# Patient Record
Sex: Female | Born: 1955 | Race: White | Hispanic: No | Marital: Married | Smoking: Never smoker
Health system: Southern US, Academic
[De-identification: ages and names within clinical notes are randomized; demographics above are authoritative.]

## PROBLEM LIST (undated history)

## (undated) DIAGNOSIS — F419 Anxiety disorder, unspecified: Secondary | ICD-10-CM

## (undated) DIAGNOSIS — K760 Fatty (change of) liver, not elsewhere classified: Secondary | ICD-10-CM

## (undated) DIAGNOSIS — N816 Rectocele: Secondary | ICD-10-CM

## (undated) DIAGNOSIS — M549 Dorsalgia, unspecified: Secondary | ICD-10-CM

## (undated) DIAGNOSIS — R109 Unspecified abdominal pain: Secondary | ICD-10-CM

## (undated) DIAGNOSIS — M25552 Pain in left hip: Secondary | ICD-10-CM

## (undated) DIAGNOSIS — Z6841 Body Mass Index (BMI) 40.0 and over, adult: Secondary | ICD-10-CM

## (undated) DIAGNOSIS — Z1239 Encounter for other screening for malignant neoplasm of breast: Secondary | ICD-10-CM

## (undated) DIAGNOSIS — E1140 Type 2 diabetes mellitus with diabetic neuropathy, unspecified: Secondary | ICD-10-CM

## (undated) DIAGNOSIS — N3281 Overactive bladder: Secondary | ICD-10-CM

## (undated) DIAGNOSIS — K5909 Other constipation: Secondary | ICD-10-CM

## (undated) DIAGNOSIS — N8110 Cystocele, unspecified: Secondary | ICD-10-CM

## (undated) DIAGNOSIS — E538 Deficiency of other specified B group vitamins: Secondary | ICD-10-CM

## (undated) DIAGNOSIS — K219 Gastro-esophageal reflux disease without esophagitis: Secondary | ICD-10-CM

## (undated) DIAGNOSIS — E559 Vitamin D deficiency, unspecified: Secondary | ICD-10-CM

## (undated) DIAGNOSIS — M47819 Spondylosis without myelopathy or radiculopathy, site unspecified: Secondary | ICD-10-CM

## (undated) DIAGNOSIS — E119 Type 2 diabetes mellitus without complications: Secondary | ICD-10-CM

## (undated) DIAGNOSIS — I10 Essential (primary) hypertension: Secondary | ICD-10-CM

## (undated) DIAGNOSIS — U071 COVID-19: Secondary | ICD-10-CM

## (undated) DIAGNOSIS — G8929 Other chronic pain: Secondary | ICD-10-CM

## (undated) DIAGNOSIS — I739 Peripheral vascular disease, unspecified: Secondary | ICD-10-CM

## (undated) DIAGNOSIS — N819 Female genital prolapse, unspecified: Secondary | ICD-10-CM

## (undated) DIAGNOSIS — E7800 Pure hypercholesterolemia, unspecified: Secondary | ICD-10-CM

## (undated) HISTORY — PX: HX APPENDECTOMY: SHX54

## (undated) HISTORY — DX: Other chronic pain: G89.29

## (undated) HISTORY — DX: Other constipation: K59.09

## (undated) HISTORY — PX: HX TUBAL LIGATION: SHX77

## (undated) HISTORY — DX: Fatty (change of) liver, not elsewhere classified: K76.0

## (undated) HISTORY — DX: Overactive bladder: N32.81

## (undated) HISTORY — DX: Body Mass Index (BMI) 40.0 and over, adult: Z684

## (undated) HISTORY — PX: COLONOSCOPY: WVUENDOPRO10

## (undated) HISTORY — DX: Gastro-esophageal reflux disease without esophagitis: K21.9

## (undated) HISTORY — DX: Deficiency of other specified B group vitamins: E53.8

## (undated) HISTORY — DX: Pain in left hip: M25.552

## (undated) HISTORY — DX: Peripheral vascular disease, unspecified: I73.9

## (undated) HISTORY — PX: REPAIR RECTOCELE: SUR1206

## (undated) HISTORY — DX: Rectocele: N81.6

## (undated) HISTORY — DX: Type 2 diabetes mellitus without complications: E11.9

## (undated) HISTORY — DX: Dorsalgia, unspecified: M54.9

## (undated) HISTORY — DX: Essential (primary) hypertension: I10

## (undated) HISTORY — DX: Pure hypercholesterolemia, unspecified: E78.00

## (undated) HISTORY — DX: Spondylosis without myelopathy or radiculopathy, site unspecified: M47.819

## (undated) HISTORY — DX: COVID-19: U07.1

## (undated) HISTORY — DX: Cystocele, unspecified: N81.10

## (undated) HISTORY — DX: Unspecified abdominal pain: R10.9

## (undated) HISTORY — DX: Anxiety disorder, unspecified: F41.9

## (undated) HISTORY — PX: BLADDER SURGERY: SHX569

## (undated) HISTORY — DX: Vitamin D deficiency, unspecified: E55.9

## (undated) HISTORY — DX: Female genital prolapse, unspecified: N81.9

## (undated) HISTORY — DX: Morbid (severe) obesity due to excess calories: E66.01

## (undated) HISTORY — DX: Type 2 diabetes mellitus with diabetic neuropathy, unspecified: E11.40

## (undated) HISTORY — DX: Encounter for other screening for malignant neoplasm of breast: Z12.39

## (undated) HISTORY — PX: HX HYSTERECTOMY: SHX81

---

## 1991-05-02 HISTORY — PX: HX TUBAL LIGATION: SHX77

## 1993-05-01 HISTORY — PX: HX HYSTERECTOMY: SHX81

## 1993-06-16 ENCOUNTER — Other Ambulatory Visit (HOSPITAL_COMMUNITY): Payer: Self-pay | Admitting: OBSTETRICS/GYNECOLOGY

## 2018-06-27 DIAGNOSIS — K5909 Other constipation: Secondary | ICD-10-CM

## 2018-06-27 DIAGNOSIS — N3281 Overactive bladder: Secondary | ICD-10-CM | POA: Insufficient documentation

## 2018-06-27 DIAGNOSIS — E1142 Type 2 diabetes mellitus with diabetic polyneuropathy: Secondary | ICD-10-CM | POA: Insufficient documentation

## 2018-06-27 DIAGNOSIS — E119 Type 2 diabetes mellitus without complications: Secondary | ICD-10-CM | POA: Insufficient documentation

## 2018-06-27 DIAGNOSIS — E669 Obesity, unspecified: Secondary | ICD-10-CM | POA: Insufficient documentation

## 2018-06-27 DIAGNOSIS — N819 Female genital prolapse, unspecified: Secondary | ICD-10-CM

## 2018-06-27 HISTORY — DX: Female genital prolapse, unspecified: N81.9

## 2018-06-27 HISTORY — DX: Overactive bladder: N32.81

## 2018-06-27 HISTORY — DX: Other constipation: K59.09

## 2019-11-30 HISTORY — PX: BLADDER SURGERY: SHX569

## 2020-11-02 IMAGING — MR MRI HIP RT W/O CONTRAST
5 of 6 series · 27 of 40 positions shown · IV contrast (gadolinium)
Comparison: None available.

﻿EXAM:  [id number]   MRI HIP RT W/O CONTRAST
INDICATION: Right hip pain for several months.
TECHNIQUE: Multiplanar multisequential MRI of the pelvis and right hip joint was performed without gadolinium contrast.

[Series 13: T1 · axial · right · 6.0mm · 0.68mm/px · z∈[-9,+180]mm · 8 of 30 slices shown (1 of 3)]
[im 1/30]
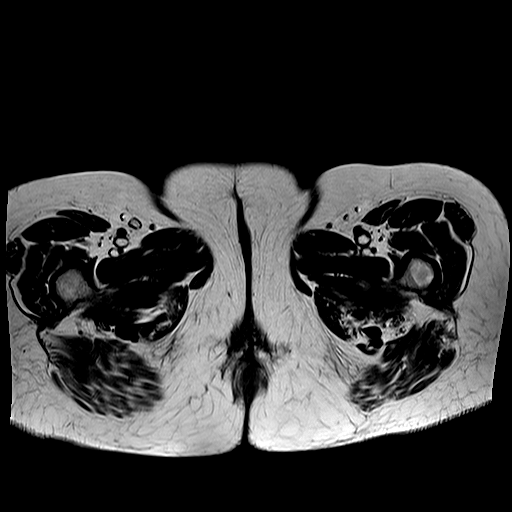
[im 5/30]
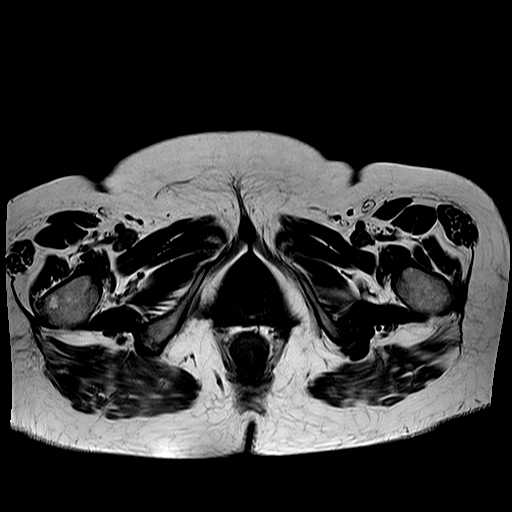
[im 9/30]
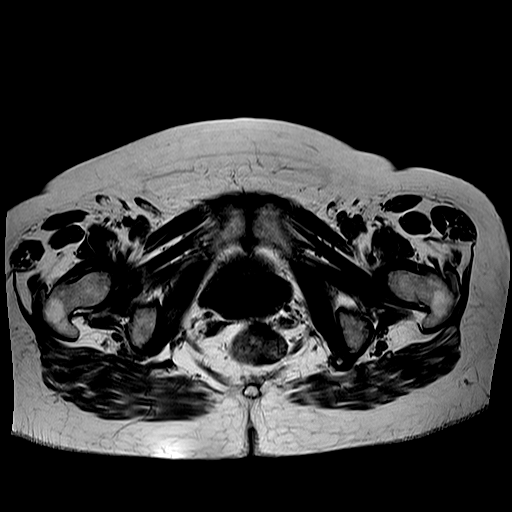
[im 13/30]
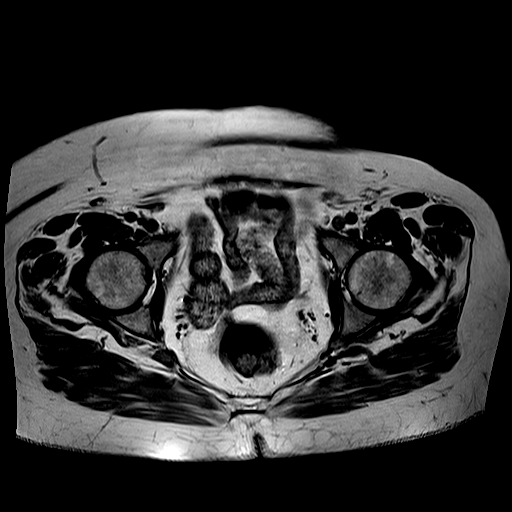
[im 17/30]
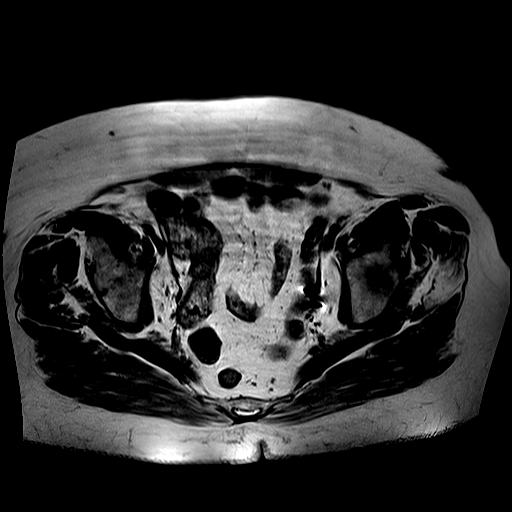
[im 21/30]
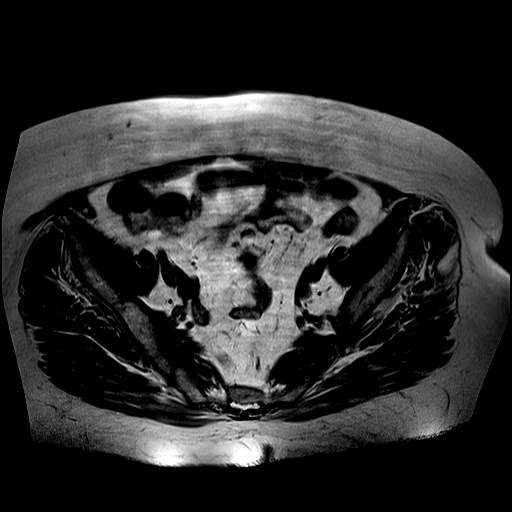
[im 25/30]
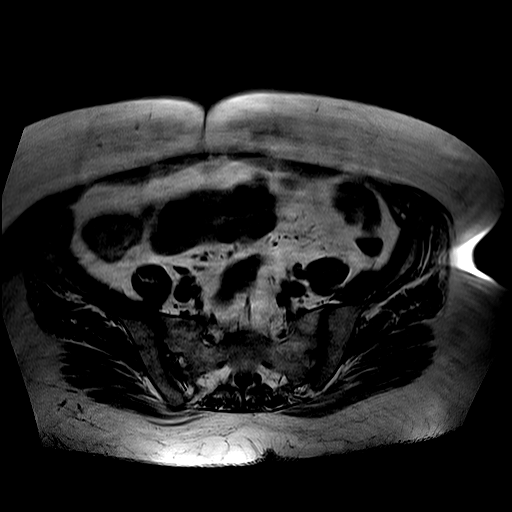
[im 30/30]
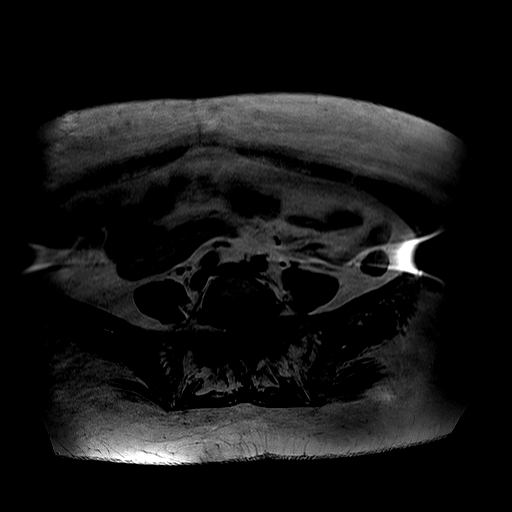

[Series 15: T1 · sagittal · right · 6.0mm · 0.62mm/px · 6 of 20 slices shown (2 of 3)]
[im 1/20]
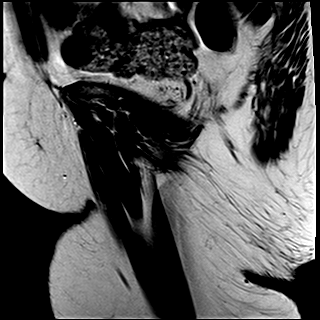
[im 4/20]
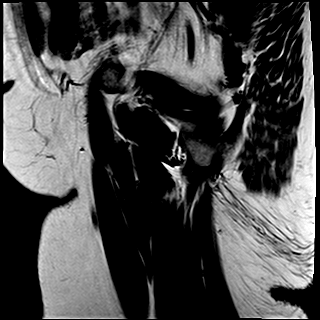
[im 8/20]
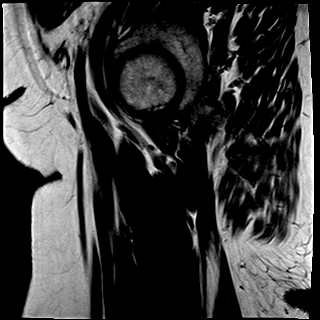
[im 12/20]
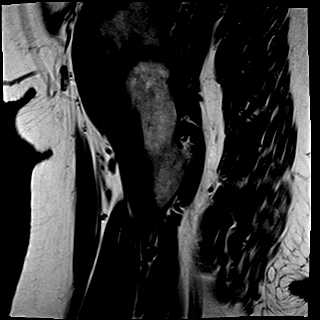
[im 16/20]
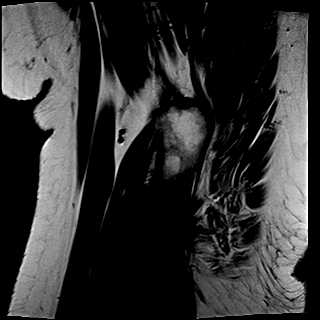
[im 20/20]
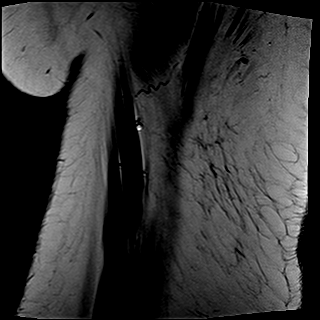

[Series 16: T2 fat-sat · sagittal · right · 6.0mm · 0.78mm/px · 6 of 20 slices shown (1 of 2)]
[im 1/20]
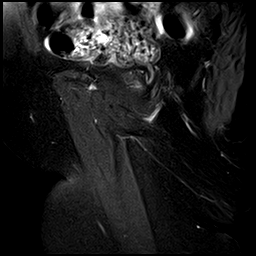
[im 4/20]
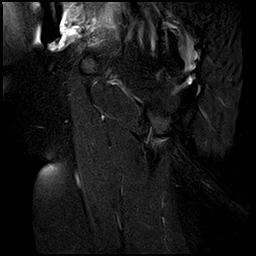
[im 8/20]
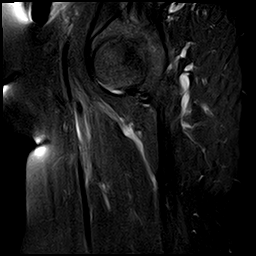
[im 12/20]
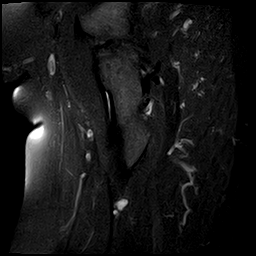
[im 16/20]
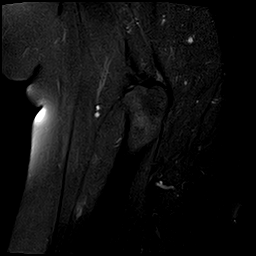
[im 20/20]
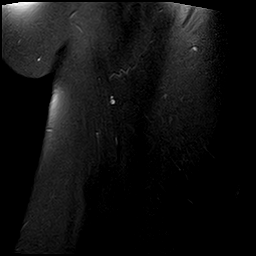

[Series 17: T1 · coronal · right · 7.0mm · 0.70mm/px · 1 of 20 slices shown (3 of 3)]
[im 1/20]
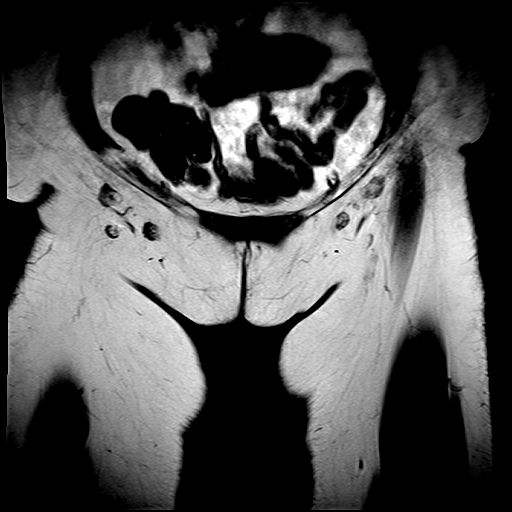

[Series 18: T2 fat-sat · coronal · right · 7.0mm · 0.70mm/px · 6 of 20 slices shown (2 of 2)]
[im 1/20]
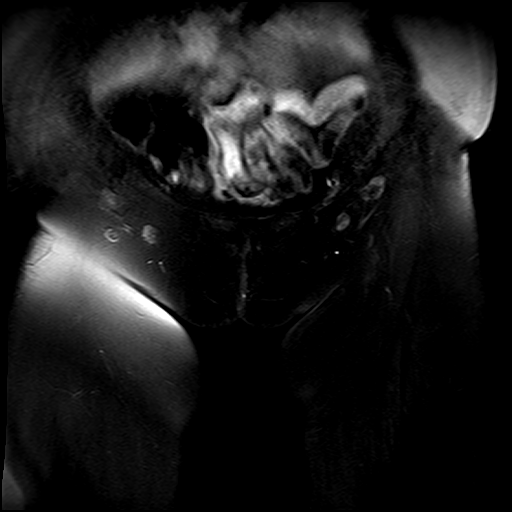
[im 4/20]
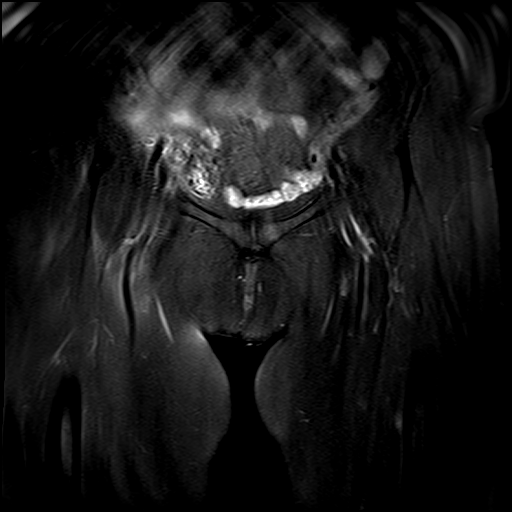
[im 8/20]
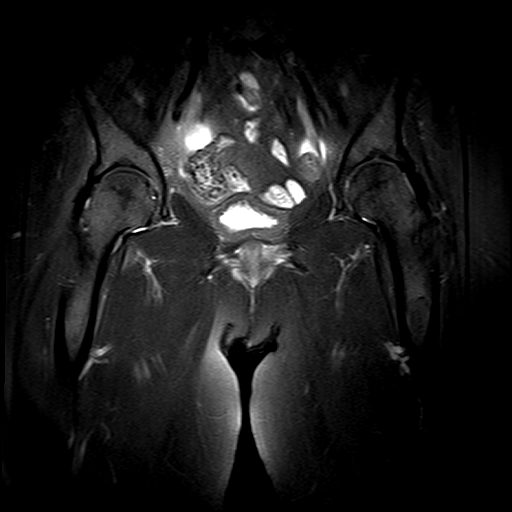
[im 12/20]
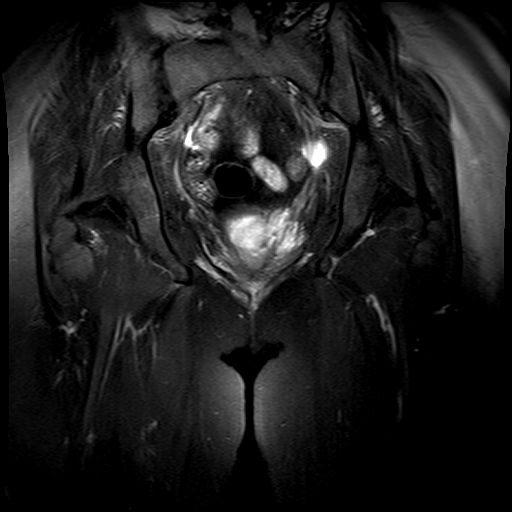
[im 16/20]
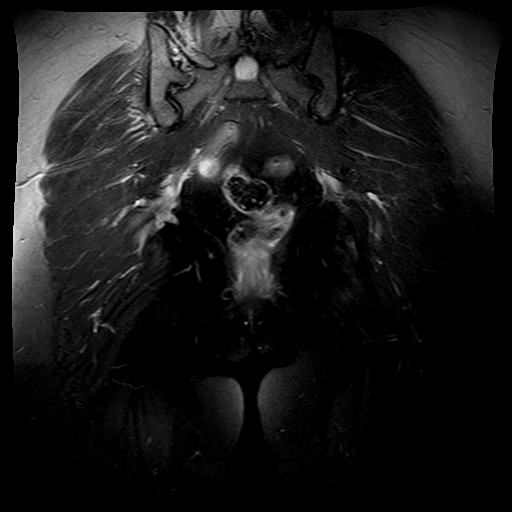
[im 20/20]
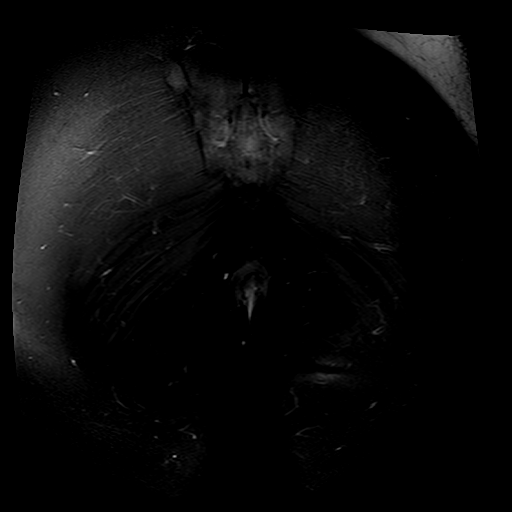

[27 of 40 positions shown; findings below may reference images not displayed]

FINDINGS: Bone marrow signal intensity is normal. There is no acute fracture or subluxation. Hip joints are well maintained. There is no significant hip effusion on either side. There is moderate right and mild left hip proximal hamstring tendinopathy. There is no evidence of iliopsoas or greater trochanteric bursitis. Muscles and soft tissues about the pelvic girdle appear unremarkable. Evaluation of pelvic parenchymal structures is limited without definite abnormality.
IMPRESSION: Moderate right and mild left proximal hamstring tendinopathy.

## 2021-05-01 HISTORY — PX: REPAIR RECTOCELE: SUR1206

## 2021-07-09 ENCOUNTER — Other Ambulatory Visit (RURAL_HEALTH_CENTER): Payer: Self-pay | Admitting: Family

## 2021-08-01 ENCOUNTER — Encounter (RURAL_HEALTH_CENTER): Payer: Self-pay | Admitting: Family

## 2021-08-01 ENCOUNTER — Ambulatory Visit (RURAL_HEALTH_CENTER): Payer: Medicare Other | Attending: Family | Admitting: Family

## 2021-08-01 ENCOUNTER — Other Ambulatory Visit: Payer: Self-pay

## 2021-08-01 VITALS — BP 128/84 | HR 94 | Temp 98.9°F | Resp 19 | Ht 66.0 in | Wt 231.4 lb

## 2021-08-01 DIAGNOSIS — K219 Gastro-esophageal reflux disease without esophagitis: Secondary | ICD-10-CM | POA: Insufficient documentation

## 2021-08-01 DIAGNOSIS — Z7182 Exercise counseling: Secondary | ICD-10-CM | POA: Insufficient documentation

## 2021-08-01 DIAGNOSIS — E1169 Type 2 diabetes mellitus with other specified complication: Secondary | ICD-10-CM | POA: Insufficient documentation

## 2021-08-01 DIAGNOSIS — Z7985 Long-term (current) use of injectable non-insulin antidiabetic drugs: Secondary | ICD-10-CM | POA: Insufficient documentation

## 2021-08-01 DIAGNOSIS — Z7984 Long term (current) use of oral hypoglycemic drugs: Secondary | ICD-10-CM | POA: Insufficient documentation

## 2021-08-01 DIAGNOSIS — F419 Anxiety disorder, unspecified: Secondary | ICD-10-CM

## 2021-08-01 DIAGNOSIS — I129 Hypertensive chronic kidney disease with stage 1 through stage 4 chronic kidney disease, or unspecified chronic kidney disease: Secondary | ICD-10-CM | POA: Insufficient documentation

## 2021-08-01 DIAGNOSIS — J3089 Other allergic rhinitis: Secondary | ICD-10-CM | POA: Insufficient documentation

## 2021-08-01 DIAGNOSIS — Z713 Dietary counseling and surveillance: Secondary | ICD-10-CM | POA: Insufficient documentation

## 2021-08-01 DIAGNOSIS — Z1239 Encounter for other screening for malignant neoplasm of breast: Secondary | ICD-10-CM | POA: Insufficient documentation

## 2021-08-01 DIAGNOSIS — E1122 Type 2 diabetes mellitus with diabetic chronic kidney disease: Secondary | ICD-10-CM | POA: Insufficient documentation

## 2021-08-01 DIAGNOSIS — I10 Essential (primary) hypertension: Secondary | ICD-10-CM | POA: Insufficient documentation

## 2021-08-01 DIAGNOSIS — E1142 Type 2 diabetes mellitus with diabetic polyneuropathy: Secondary | ICD-10-CM | POA: Insufficient documentation

## 2021-08-01 DIAGNOSIS — Z1211 Encounter for screening for malignant neoplasm of colon: Secondary | ICD-10-CM | POA: Insufficient documentation

## 2021-08-01 DIAGNOSIS — E782 Mixed hyperlipidemia: Secondary | ICD-10-CM | POA: Insufficient documentation

## 2021-08-01 DIAGNOSIS — E559 Vitamin D deficiency, unspecified: Secondary | ICD-10-CM | POA: Insufficient documentation

## 2021-08-01 DIAGNOSIS — Z6841 Body Mass Index (BMI) 40.0 and over, adult: Secondary | ICD-10-CM | POA: Insufficient documentation

## 2021-08-01 HISTORY — DX: Anxiety disorder, unspecified: F41.9

## 2021-08-01 MED ORDER — METFORMIN 500 MG TABLET
1000.0000 mg | ORAL_TABLET | Freq: Two times a day (BID) | ORAL | 1 refills | Status: DC
Start: 2021-08-01 — End: 2022-03-13

## 2021-08-01 MED ORDER — ROSUVASTATIN 20 MG TABLET
20.0000 mg | ORAL_TABLET | Freq: Every day | ORAL | 1 refills | Status: DC
Start: 2021-08-01 — End: 2022-03-09

## 2021-08-01 MED ORDER — CYANOCOBALAMIN (VIT B-12) 1,000 MCG TABLET
1000.0000 ug | ORAL_TABLET | Freq: Every day | ORAL | 1 refills | Status: DC
Start: 2021-08-01 — End: 2022-06-19

## 2021-08-01 MED ORDER — OMEGA-3 ACID ETHYL ESTERS 1 GRAM CAPSULE
2.0000 g | ORAL_CAPSULE | Freq: Two times a day (BID) | ORAL | 1 refills | Status: DC
Start: 2021-08-01 — End: 2022-01-31

## 2021-08-01 NOTE — Progress Notes (Signed)
FAMILY MEDICINE, [facility] FAMILY MEDICINE [facility]  [address]  [place] [address]  Operated by [facility]     Name: Amber Owens MRN:  [id number]   Date of Birth: 1956-03-19 Age: 66 y.o.   Date: 08/01/2021  Time: 15:36     Provider: Stormy Fabian, NP-C    Reason for visit: Follow Up 6 Months      History of Present Illness:  Amber Owens is a 66 y.o. female presenting with chronic disease management.     Patient Active Problem List    Diagnosis Date Noted   . 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  Last eye exam 12/18/20, Dr. Micheline Rough.      . Essential hypertension 08/01/2021   . Mixed hyperlipidemia 08/01/2021   . Gastroesophageal reflux disease without esophagitis 08/01/2021   . Anxiety 08/01/2021   . Morbid obesity with BMI of 40.0-44.9, adult (CMS HCC) 08/01/2021   . Environmental and seasonal allergies 08/01/2021       Historical Data    Past Medical History:  No past medical history on file.  Past Surgical History:  No past surgical history on file.  Allergies:  No Known Allergies  Medications:  Current Outpatient Medications   Medication Sig   . aspirin (ECOTRIN) 81 mg Oral Tablet, Delayed Release (E.C.) Take 1 Tablet (81 mg total) by mouth Once a day   . cholecalciferol, vitamin D3, 25 mcg (1,000 unit) Oral Tablet Take 1 Tablet (1,000 Units total) by mouth Once a day   . collagen/biotin/ascorbic acid (COLLAGEN 1500 PLUS C ORAL) Take 1,000 mg by mouth   . cyanocobalamin (VITAMIN B 12) 1,000 mcg Oral Tablet Take 1 Tablet (1,000 mcg total) by mouth Once a day   . cyclobenzaprine (FLEXERIL) 10 mg Oral Tablet Take 1 Tablet (10 mg total) by mouth   . liraglutide (VICTOZA 2-PAK) 0.6 mg/0.1 mL (18 mg/3 mL) Subcutaneous Pen Injector Inject 0.6 mg under the skin Once a day (Patient not taking: Reported on 08/01/2021)   . losartan (COZAAR) 25 mg Oral Tablet TAKE 1 TABLET BY MOUTH EVERY DAY FOR HYPERTENSION   . magnesium chloride (SLOW-MAG) 64  mg Oral Tablet, Delayed Release (E.C.) Take 1 Tablet (64 mg total) by mouth   . metFORMIN (GLUCOPHAGE) 500 mg Oral Tablet Take 2 Tablets (1,000 mg total) by mouth Twice daily   . multivit-min-mfolate-K-herb289 (ALIVE WOMEN'S 50 PLUS ULTRA) 800 mcg DFE- 150 mcg Oral Tablet Take 1 Tablet by mouth Once a day   . omega-3 fatty acid (LOVAZA) 1 gram Oral Capsule Take 2 Capsules (2 g total) by mouth Twice daily   . omeprazole (PRILOSEC) 20 mg Oral Capsule, Delayed Release(E.C.) Take 1 Capsule (20 mg total) by mouth Once a day   . potassium gluconate 600 mg (99 mg) Oral Tablet Take 1 Tablet by mouth   . rosuvastatin (CRESTOR) 20 mg Oral Tablet Take 1 Tablet (20 mg total) by mouth Once a day     Family History:  Family Medical History:    None         Social History:  Social History     Socioeconomic History   . Marital status: Married           Review of Systems:  Any pertinent Review of Systems as addressed in the HPI above.    Physical Exam:  Vital Signs:  Vitals:    08/01/21 1455  BP: 128/84   Pulse: 94   Resp: 19   Temp: 37.2 C (98.9 F)   SpO2: 95%   Weight: 105 kg (231 lb 6 oz)   Height: 1.676 m (5\' 6" )   BMI: 37.42     Physical Exam  Constitutional:       Appearance: Normal appearance.   HENT:      Head: Normocephalic.      Right Ear: Tympanic membrane normal.      Left Ear: Tympanic membrane normal.      Nose: Rhinorrhea present.      Right Turbinates: Swollen.      Left Turbinates: Enlarged and swollen.      Mouth/Throat:      Mouth: Mucous membranes are dry.      Pharynx: Oropharynx is clear.   Eyes:      Extraocular Movements: Extraocular movements intact.      Conjunctiva/sclera: Conjunctivae normal.      Pupils: Pupils are equal, round, and reactive to light.   Cardiovascular:      Rate and Rhythm: Normal rate and regular rhythm.      Pulses: Normal pulses.      Heart sounds: Normal heart sounds.   Pulmonary:      Effort: Pulmonary effort is normal.      Breath sounds: Normal breath sounds.   Abdominal:       General: Bowel sounds are normal.      Palpations: Abdomen is soft.   Musculoskeletal:         General: Normal range of motion.      Cervical back: Normal range of motion and neck supple.   Skin:     General: Skin is warm and dry.   Neurological:      General: No focal deficit present.      Mental Status: She is alert and oriented to person, place, and time.   Psychiatric:         Mood and Affect: Mood normal.         Behavior: Behavior normal.         Thought Content: Thought content normal.         Judgment: Judgment normal.          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)  (primary encounter diagnosis)  Plan: CBC, COMPREHENSIVE METABOLIC PNL, FASTING,         HGA1C (HEMOGLOBIN A1C WITH EST AVG GLUCOSE),         LIPID PANEL, MICROALBUMIN URINE, RANDOM,         VITAMIN B12, VITAMIN D 25 TOTAL, CANCELED: CBC,        CANCELED: COMPREHENSIVE METABOLIC PNL, FASTING,        CANCELED: HGA1C (HEMOGLOBIN A1C WITH EST AVG         GLUCOSE), CANCELED: LIPID PANEL, CANCELED:         VITAMIN B12, CANCELED: VITAMIN D 25 TOTAL,         CANCELED: MICROALBUMIN URINE, RANDOM    (I10) Essential hypertension  Plan: CBC, COMPREHENSIVE METABOLIC PNL, FASTING,         HGA1C (HEMOGLOBIN A1C WITH EST AVG GLUCOSE),         LIPID PANEL, MICROALBUMIN URINE, RANDOM,         VITAMIN B12, VITAMIN D 25 TOTAL, CANCELED: CBC,        CANCELED: COMPREHENSIVE METABOLIC PNL, FASTING,        CANCELED: HGA1C (HEMOGLOBIN A1C  WITH EST AVG         GLUCOSE), CANCELED: LIPID PANEL, CANCELED:         VITAMIN B12, CANCELED: VITAMIN D 25 TOTAL,         CANCELED: MICROALBUMIN URINE, RANDOM    (E78.2) Mixed hyperlipidemia  Plan: CBC, COMPREHENSIVE METABOLIC PNL, FASTING,         HGA1C (HEMOGLOBIN A1C WITH EST AVG GLUCOSE),         LIPID PANEL, MICROALBUMIN URINE, RANDOM,         VITAMIN B12, VITAMIN D 25 TOTAL, CANCELED: CBC,        CANCELED: COMPREHENSIVE METABOLIC PNL, FASTING,        CANCELED: HGA1C (HEMOGLOBIN A1C  WITH EST AVG         GLUCOSE), CANCELED: LIPID PANEL, CANCELED:         VITAMIN B12, CANCELED: VITAMIN D 25 TOTAL,         CANCELED: MICROALBUMIN URINE, RANDOM    (K21.9) Gastroesophageal reflux disease without esophagitis  Plan: CBC, COMPREHENSIVE METABOLIC PNL, FASTING,         HGA1C (HEMOGLOBIN A1C WITH EST AVG GLUCOSE),         LIPID PANEL, MICROALBUMIN URINE, RANDOM,         VITAMIN B12, VITAMIN D 25 TOTAL, CANCELED: CBC,        CANCELED: COMPREHENSIVE METABOLIC PNL, FASTING,        CANCELED: HGA1C (HEMOGLOBIN A1C WITH EST AVG         GLUCOSE), CANCELED: LIPID PANEL, CANCELED:         VITAMIN B12, CANCELED: VITAMIN D 25 TOTAL,         CANCELED: MICROALBUMIN URINE, RANDOM    (F41.9) Anxiety  Plan: CBC, COMPREHENSIVE METABOLIC PNL, FASTING,         HGA1C (HEMOGLOBIN A1C WITH EST AVG GLUCOSE),         LIPID PANEL, MICROALBUMIN URINE, RANDOM,         VITAMIN B12, VITAMIN D 25 TOTAL, CANCELED: CBC,        CANCELED: COMPREHENSIVE METABOLIC PNL, FASTING,        CANCELED: HGA1C (HEMOGLOBIN A1C WITH EST AVG         GLUCOSE), CANCELED: LIPID PANEL, CANCELED:         VITAMIN B12, CANCELED: VITAMIN D 25 TOTAL,         CANCELED: MICROALBUMIN URINE, RANDOM    (E66.01,  Z68.41) Morbid obesity with BMI of 40.0-44.9, adult (CMS HCC)  Plan: CBC, COMPREHENSIVE METABOLIC PNL, FASTING,         HGA1C (HEMOGLOBIN A1C WITH EST AVG GLUCOSE),         LIPID PANEL, MICROALBUMIN URINE, RANDOM,         VITAMIN B12, VITAMIN D 25 TOTAL, CANCELED: CBC,        CANCELED: COMPREHENSIVE METABOLIC PNL, FASTING,        CANCELED: HGA1C (HEMOGLOBIN A1C WITH EST AVG         GLUCOSE), CANCELED: LIPID PANEL, CANCELED:         VITAMIN B12, CANCELED: VITAMIN D 25 TOTAL,         CANCELED: MICROALBUMIN URINE, RANDOM    (J30.89) Environmental and seasonal allergies    (Z12.39) Encounter for screening for malignant neoplasm of breast, unspecified screening modality  Plan: MAMMO BILATERAL SCREENING       Labs drawn today.  Continue current medications.  Advised  a low-fat/low sodium diet, advised 150 minutes of scheduled  weekly physical activity as tolerated.  Advised to maintain a healthy weight.      Return in about 3 months (around 10/31/2021) for routine visit; schedule to return on friday to discuss labs.    Stormy Fabianathy L Khali Perella, NP-C     Portions of this note may be dictated using voice recognition software or a dictation service. Variances in spelling and vocabulary are possible and unintentional. Not all errors are caught/corrected. Please notify the [facility] if any discrepancies are noted or if the meaning of any statement is not clear.

## 2021-08-01 NOTE — Nursing Note (Signed)
Patient is here for follow up with medication refills and no new concerns.

## 2021-08-04 ENCOUNTER — Other Ambulatory Visit (RURAL_HEALTH_CENTER): Payer: Self-pay | Admitting: Family

## 2021-08-05 ENCOUNTER — Other Ambulatory Visit: Payer: Self-pay

## 2021-08-05 ENCOUNTER — Encounter (RURAL_HEALTH_CENTER): Payer: Self-pay | Admitting: Family

## 2021-08-05 ENCOUNTER — Ambulatory Visit (RURAL_HEALTH_CENTER): Payer: Medicare Other | Attending: Family | Admitting: Family

## 2021-08-05 VITALS — BP 124/84 | HR 79 | Temp 98.0°F | Resp 20 | Ht 65.0 in | Wt 231.4 lb

## 2021-08-05 DIAGNOSIS — E1142 Type 2 diabetes mellitus with diabetic polyneuropathy: Secondary | ICD-10-CM | POA: Insufficient documentation

## 2021-08-05 DIAGNOSIS — Z7984 Long term (current) use of oral hypoglycemic drugs: Secondary | ICD-10-CM | POA: Insufficient documentation

## 2021-08-05 DIAGNOSIS — E559 Vitamin D deficiency, unspecified: Secondary | ICD-10-CM | POA: Insufficient documentation

## 2021-08-05 DIAGNOSIS — F419 Anxiety disorder, unspecified: Secondary | ICD-10-CM | POA: Insufficient documentation

## 2021-08-05 DIAGNOSIS — Z7985 Long-term (current) use of injectable non-insulin antidiabetic drugs: Secondary | ICD-10-CM | POA: Insufficient documentation

## 2021-08-05 DIAGNOSIS — E782 Mixed hyperlipidemia: Secondary | ICD-10-CM | POA: Insufficient documentation

## 2021-08-05 DIAGNOSIS — K219 Gastro-esophageal reflux disease without esophagitis: Secondary | ICD-10-CM | POA: Insufficient documentation

## 2021-08-05 DIAGNOSIS — Z6841 Body Mass Index (BMI) 40.0 and over, adult: Secondary | ICD-10-CM | POA: Insufficient documentation

## 2021-08-05 DIAGNOSIS — I10 Essential (primary) hypertension: Secondary | ICD-10-CM | POA: Insufficient documentation

## 2021-08-05 MED ORDER — PIOGLITAZONE 30 MG TABLET
30.0000 mg | ORAL_TABLET | Freq: Every day | ORAL | 1 refills | Status: DC
Start: 2021-08-05 — End: 2022-03-13

## 2021-08-05 MED ORDER — PHENTERMINE 37.5 MG TABLET
37.5000 mg | ORAL_TABLET | Freq: Every morning | ORAL | 2 refills | Status: AC
Start: 2021-08-05 — End: 2021-09-04

## 2021-08-05 NOTE — Progress Notes (Signed)
[facility]    [facility] MEDICINE [facility]  [facility] FAMILY MEDICINE      Amber Owens  MRN: [id number]  DOB: [birth year]  Date of Service: 08/05/2021    CHIEF COMPLAINT  Chief Complaint   Patient presents with   . Lab Results       SUBJECTIVE  Amber Owens is a 66 y.o. female who presents to clinic for discuss labs.    Patient reports she does not have insurance and is unable to afford her diabetic medications.  Patient reports she is only able to for medications on the generic store list.    Review of Systems:  Positive ROS discussed in HPI, otherwise all other systems negative.      Medications:   aspirin (ECOTRIN) 81 mg Oral Tablet, Delayed Release (E.C.), Take 1 Tablet (81 mg total) by mouth Once a day  cholecalciferol, vitamin D3, 25 mcg (1,000 unit) Oral Tablet, Take 1 Tablet (1,000 Units total) by mouth Once a day  collagen/biotin/ascorbic acid (COLLAGEN 1500 PLUS C ORAL), Take 1,000 mg by mouth  cyanocobalamin (VITAMIN B 12) 1,000 mcg Oral Tablet, Take 1 Tablet (1,000 mcg total) by mouth Once a day  cyclobenzaprine (FLEXERIL) 10 mg Oral Tablet, Take 1 Tablet (10 mg total) by mouth  liraglutide (VICTOZA 2-PAK) 0.6 mg/0.1 mL (18 mg/3 mL) Subcutaneous Pen Injector, Inject 0.6 mg under the skin Once a day  losartan (COZAAR) 25 mg Oral Tablet, TAKE 1 TABLET BY MOUTH EVERY DAY FOR BLOOD PRESSURE  magnesium chloride (SLOW-MAG) 64 mg Oral Tablet, Delayed Release (E.C.), Take 1 Tablet (64 mg total) by mouth  metFORMIN (GLUCOPHAGE) 500 mg Oral Tablet, Take 2 Tablets (1,000 mg total) by mouth Twice daily  multivit-min-mfolate-K-herb289 (ALIVE WOMEN'S 50 PLUS ULTRA) 800 mcg DFE- 150 mcg Oral Tablet, Take 1 Tablet by mouth Once a day  omega-3 fatty acid (LOVAZA) 1 gram Oral Capsule, Take 2 Capsules (2 g total) by mouth Twice daily  omeprazole (PRILOSEC) 20 mg Oral Capsule, Delayed Release(E.C.), Take 1 Capsule (20 mg total) by mouth Once a day  potassium gluconate 600 mg (99 mg) Oral Tablet, Take 1 Tablet by  mouth  rosuvastatin (CRESTOR) 20 mg Oral Tablet, Take 1 Tablet (20 mg total) by mouth Once a day    No facility-administered medications prior to visit.      Allergies:   No Known Allergies      OBJECTIVE  BP 124/84   Pulse 79   Temp 36.7 C (98 F)   Resp 20   Ht 1.651 m (5\' 5" )   Wt 105 kg (231 lb 6 oz)   SpO2 96%   BMI 38.50 kg/m       Physical Exam  Constitutional:       Appearance: She is obese.   Cardiovascular:      Rate and Rhythm: Normal rate and regular rhythm.      Pulses: Normal pulses.      Heart sounds: Normal heart sounds.   Pulmonary:      Effort: Pulmonary effort is normal.      Breath sounds: Normal breath sounds.           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  Patient will continue metformin.  Patient will start Actos 30 mg daily for type 2 diabetes.  A1c 7.8 today.  Discussed importance of a diabetic diet, 150 minutes of weekly scheduled physical activity and to reduce weight.      Obesity:  Start phentermine 37.5 mg daily x3 months.  Patient understands that she will have to incorporate lifestyle changes to decrease weight and incorporate healthy lifestyle changes to maintain low weight.  Patient is not interested he and weight loss clinic at this time.    Continue all other medications as prescribed advised to follow-up in 3 months for chronic disease management or return to clinic sooner if needed.  Plan of care agree to.    Return if symptoms worsen or fail to improve.    Stormy Fabian, NP-C 08/05/2021, 10:57

## 2021-08-05 NOTE — Nursing Note (Signed)
Patient is here to discuss labs.

## 2021-08-12 ENCOUNTER — Telehealth (RURAL_HEALTH_CENTER): Payer: Self-pay | Admitting: Family

## 2021-08-12 NOTE — Telephone Encounter (Signed)
Patient would like an order for her Bone Density Scan so she can try to get it on the same day as her Mammogram.

## 2021-08-17 ENCOUNTER — Encounter (HOSPITAL_COMMUNITY): Payer: Self-pay

## 2021-08-17 ENCOUNTER — Encounter (INDEPENDENT_AMBULATORY_CARE_PROVIDER_SITE_OTHER): Payer: Self-pay

## 2021-08-17 ENCOUNTER — Other Ambulatory Visit: Payer: Self-pay

## 2021-08-17 ENCOUNTER — Inpatient Hospital Stay
Admission: RE | Admit: 2021-08-17 | Discharge: 2021-08-17 | Disposition: A | Discharge status: Home / Self Care | Payer: Medicare Other | Source: Ambulatory Visit | Attending: Family | Admitting: Family

## 2021-08-17 DIAGNOSIS — Z1239 Encounter for other screening for malignant neoplasm of breast: Secondary | ICD-10-CM

## 2021-08-17 DIAGNOSIS — Z1231 Encounter for screening mammogram for malignant neoplasm of breast: Secondary | ICD-10-CM | POA: Insufficient documentation

## 2021-08-25 ENCOUNTER — Telehealth (RURAL_HEALTH_CENTER): Payer: Self-pay | Admitting: Family

## 2021-08-25 NOTE — Telephone Encounter (Signed)
-----   Message from Stormy Fabian, NP-C sent at 08/18/2021  8:04 AM EDT -----  Inform mammogram is normal.  Follow up in 1 year.

## 2021-08-25 NOTE — Telephone Encounter (Signed)
Patient aware of results.

## 2021-08-28 ENCOUNTER — Other Ambulatory Visit (RURAL_HEALTH_CENTER): Payer: Self-pay | Admitting: Family

## 2021-11-02 ENCOUNTER — Ambulatory Visit (RURAL_HEALTH_CENTER): Payer: Self-pay | Admitting: Family

## 2021-11-08 ENCOUNTER — Other Ambulatory Visit (RURAL_HEALTH_CENTER): Payer: Self-pay | Admitting: Family

## 2021-11-08 ENCOUNTER — Telehealth (RURAL_HEALTH_CENTER): Payer: Self-pay | Admitting: Family

## 2021-11-08 DIAGNOSIS — F419 Anxiety disorder, unspecified: Secondary | ICD-10-CM

## 2021-11-08 DIAGNOSIS — E782 Mixed hyperlipidemia: Secondary | ICD-10-CM

## 2021-11-08 DIAGNOSIS — E1142 Type 2 diabetes mellitus with diabetic polyneuropathy: Secondary | ICD-10-CM

## 2021-11-08 DIAGNOSIS — J3089 Other allergic rhinitis: Secondary | ICD-10-CM

## 2021-11-08 DIAGNOSIS — E538 Deficiency of other specified B group vitamins: Secondary | ICD-10-CM | POA: Insufficient documentation

## 2021-11-08 DIAGNOSIS — I10 Essential (primary) hypertension: Secondary | ICD-10-CM

## 2021-11-08 DIAGNOSIS — E559 Vitamin D deficiency, unspecified: Secondary | ICD-10-CM

## 2021-11-08 DIAGNOSIS — K219 Gastro-esophageal reflux disease without esophagitis: Secondary | ICD-10-CM

## 2021-11-08 NOTE — Telephone Encounter (Signed)
I looked in [facility] Chart and I do not see any lab orders. Thank You

## 2021-11-08 NOTE — Telephone Encounter (Signed)
Patient is wanting her lab orders in so she can come by and pick them up to go to [facility].  Thank You

## 2021-12-12 ENCOUNTER — Ambulatory Visit (RURAL_HEALTH_CENTER): Payer: Medicare Other | Attending: Family | Admitting: Family

## 2021-12-12 ENCOUNTER — Other Ambulatory Visit: Payer: Self-pay

## 2021-12-12 ENCOUNTER — Encounter (RURAL_HEALTH_CENTER): Payer: Self-pay | Admitting: Family

## 2021-12-12 VITALS — BP 142/84 | HR 81 | Temp 97.6°F | Resp 18 | Ht 65.0 in | Wt 231.0 lb

## 2021-12-12 DIAGNOSIS — E782 Mixed hyperlipidemia: Secondary | ICD-10-CM | POA: Insufficient documentation

## 2021-12-12 DIAGNOSIS — E1142 Type 2 diabetes mellitus with diabetic polyneuropathy: Secondary | ICD-10-CM | POA: Insufficient documentation

## 2021-12-12 DIAGNOSIS — I10 Essential (primary) hypertension: Secondary | ICD-10-CM | POA: Insufficient documentation

## 2021-12-12 DIAGNOSIS — Z6838 Body mass index (BMI) 38.0-38.9, adult: Secondary | ICD-10-CM | POA: Insufficient documentation

## 2021-12-12 DIAGNOSIS — K219 Gastro-esophageal reflux disease without esophagitis: Secondary | ICD-10-CM | POA: Insufficient documentation

## 2021-12-12 DIAGNOSIS — E559 Vitamin D deficiency, unspecified: Secondary | ICD-10-CM | POA: Insufficient documentation

## 2021-12-12 DIAGNOSIS — Z7984 Long term (current) use of oral hypoglycemic drugs: Secondary | ICD-10-CM | POA: Insufficient documentation

## 2021-12-12 DIAGNOSIS — F419 Anxiety disorder, unspecified: Secondary | ICD-10-CM | POA: Insufficient documentation

## 2021-12-12 MED ORDER — SEMAGLUTIDE 0.25 MG OR 0.5 MG (2 MG/3 ML) SUBCUTANEOUS PEN INJECTOR
PEN_INJECTOR | SUBCUTANEOUS | 0 refills | Status: DC
Start: 2021-12-12 — End: 2022-03-16

## 2021-12-12 NOTE — Progress Notes (Signed)
FAMILY MEDICINE, [facility] FAMILY MEDICINE [facility]  [address]  [place] [address]  Operated by [facility]     Name: Amber Owens MRN:  [id number]   Date of Birth: 05-14-1955 Age: 66 y.o.   Date: 12/12/2021  Time: 14:50     Provider: Stormy Fabian, NP-C    Reason for visit: Follow Up 3 Months      History of Present Illness:  Amber Owens is a 66 y.o. female presenting with chronic disease management.    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  Last eye exam 12/18/20, Dr. Micheline Rough.       Essential hypertension 08/01/2021    Mixed hyperlipidemia 08/01/2021    Gastroesophageal reflux disease without esophagitis 08/01/2021    Anxiety 08/01/2021    Morbid obesity with BMI of 40.0-44.9, adult (CMS HCC) 08/01/2021    Environmental and seasonal allergies 08/01/2021    Encounter for screening for malignant neoplasm of breast 08/01/2021    Vitamin D deficiency 08/01/2021       Historical Data    Past Medical History:  Past Medical History:   Diagnosis Date    Diabetes mellitus, type 2 (CMS HCC)      Past Surgical History:  Past Surgical History:   Procedure Laterality Date    HX HYSTERECTOMY      HX TUBAL LIGATION      REPAIR RECTOCELE       Allergies:  No Known Allergies  Medications:  Current Outpatient Medications   Medication Sig    aspirin (ECOTRIN) 81 mg Oral Tablet, Delayed Release (E.C.) Take 1 Tablet (81 mg total) by mouth Once a day    cholecalciferol, vitamin D3, 25 mcg (1,000 unit) Oral Tablet Take 1 Tablet (1,000 Units total) by mouth Once a day    collagen/biotin/ascorbic acid (COLLAGEN 1500 PLUS C ORAL) Take 1,000 mg by mouth    cyanocobalamin (VITAMIN B 12) 1,000 mcg Oral Tablet Take 1 Tablet (1,000 mcg total) by mouth Once a day    losartan (COZAAR) 25 mg Oral Tablet Take 1 Tablet (25 mg total) by mouth Once a  day for blood pressure    magnesium chloride (SLOW-MAG) 64 mg Oral Tablet, Delayed Release (E.C.) Take 1 Tablet (64 mg total) by mouth    metFORMIN (GLUCOPHAGE) 500 mg Oral Tablet Take 2 Tablets (1,000 mg total) by mouth Twice daily    multivit-min-mfolate-K-herb289 (ALIVE WOMEN'S 50 PLUS ULTRA) 800 mcg DFE- 150 mcg Oral Tablet Take 1 Tablet by mouth Once a day    omega-3 fatty acid (LOVAZA) 1 gram Oral Capsule Take 2 Capsules (2 g total) by mouth Twice daily    omeprazole (PRILOSEC) 20 mg Oral Capsule, Delayed Release(E.C.) Take 1 Capsule (20 mg total) by mouth Once a day    pioglitazone (ACTOS) 30 mg Oral Tablet Take 1 Tablet (30 mg total) by mouth Once a day for 90 days    potassium gluconate 600 mg (99 mg) Oral Tablet Take 1 Tablet by mouth    rosuvastatin (CRESTOR) 20 mg Oral Tablet Take 1 Tablet (20 mg total) by mouth Once a day     Family History:  Family Medical History:       Problem Relation (Age of Onset)    Breast  Cancer Maternal Aunt    No Known Problems Mother, Father, Sister, Brother, Maternal Grandmother, Maternal Grandfather, Paternal Grandmother, Paternal Grandfather, Daughter, Son, Maternal Uncle, Paternal Aunt, Paternal Uncle, Other            Social History:  Social History     Socioeconomic History    Marital status: Married     Spouse name: Soraya Paquette    Number of children: 1   Tobacco Use    Smoking status: Never    Smokeless tobacco: Never   Substance and Sexual Activity    Alcohol use: Never    Drug use: Never     Social Determinants of Health     Financial Resource Strain: Low Risk     SDOH Financial: No   Transportation Needs: Low Risk     SDOH Transportation: No   Social Connections: Low Risk     SDOH Social Isolation: 5 or more times a week   Intimate Partner Violence: Low Risk     SDOH Domestic Violence: No   Housing Stability: Low Risk     SDOH Housing Situation: I have housing.    SDOH Housing Worry: No           Review of Systems:  Any pertinent Review of Systems as addressed in  the HPI above.    Physical Exam:  Vital Signs:  Vitals:    12/12/21 1401   BP: (!) 142/84   Pulse: 81   Resp: 18   Temp: 36.4 C (97.6 F)   SpO2: 98%   Weight: 105 kg (231 lb)   Height: 1.651 m (5\' 5" )   BMI: 38.52     Physical Exam  Vitals reviewed.   Constitutional:       Appearance: She is obese.   HENT:      Nose:      Right Turbinates: Swollen.      Left Turbinates: Enlarged and swollen.   Cardiovascular:      Rate and Rhythm: Normal rate and regular rhythm.      Pulses: Normal pulses.      Heart sounds: Normal heart sounds, S1 normal and S2 normal.   Pulmonary:      Effort: Pulmonary effort is normal.      Breath sounds: Normal breath sounds.   Abdominal:      General: Bowel sounds are normal.   Musculoskeletal:      Cervical back: Neck supple.      Right lower leg: No edema.      Left lower leg: No edema.   Skin:     General: Skin is warm.   Neurological:      General: No focal deficit present.      Mental Status: She is alert and oriented to person, place, and time.      Motor: Motor function is intact.      Coordination: Coordination is intact.   Psychiatric:         Mood and Affect: Mood normal.         Behavior: Behavior normal. Behavior is cooperative.         Thought Content: Thought content normal.         Cognition and Memory: Cognition normal.         Judgment: Judgment normal.        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  A1c 6.7 and morbidly obese.  Start Ozempic as prescribed for Type 2 diabetes.  Discussed s/e and risks.  Continue oral medications.    Advised a low-fat/low sodium diet, advised 150 minutes of scheduled weekly physical activity as tolerated.  Continue current  medications.  Advised to continue with weight loss efforts, goal 5-7 lb weight loss in the next 3 months.      Return in about 3 months (around 03/14/2022) for routines visit; schedule medicare wellness visit.    Stormy Fabian, NP-C     Portions of this note may be dictated using voice recognition software or a dictation service. Variances in spelling and vocabulary are possible and unintentional. Not all errors are caught/corrected. Please notify the [facility] Parkin if any discrepancies are noted or if the meaning of any statement is not clear.

## 2022-01-31 ENCOUNTER — Other Ambulatory Visit (RURAL_HEALTH_CENTER): Payer: Self-pay | Admitting: Family

## 2022-01-31 DIAGNOSIS — D509 Iron deficiency anemia, unspecified: Secondary | ICD-10-CM

## 2022-03-03 ENCOUNTER — Other Ambulatory Visit (RURAL_HEALTH_CENTER): Payer: Self-pay | Admitting: Family

## 2022-03-09 ENCOUNTER — Other Ambulatory Visit (RURAL_HEALTH_CENTER): Payer: Self-pay | Admitting: Family

## 2022-03-12 ENCOUNTER — Other Ambulatory Visit (RURAL_HEALTH_CENTER): Payer: Self-pay | Admitting: Family

## 2022-03-14 ENCOUNTER — Telehealth (RURAL_HEALTH_CENTER): Payer: Self-pay | Admitting: Family

## 2022-03-14 ENCOUNTER — Other Ambulatory Visit (RURAL_HEALTH_CENTER): Payer: Self-pay | Admitting: Family

## 2022-03-14 DIAGNOSIS — J3089 Other allergic rhinitis: Secondary | ICD-10-CM

## 2022-03-14 DIAGNOSIS — K219 Gastro-esophageal reflux disease without esophagitis: Secondary | ICD-10-CM

## 2022-03-14 DIAGNOSIS — E1142 Type 2 diabetes mellitus with diabetic polyneuropathy: Secondary | ICD-10-CM

## 2022-03-14 DIAGNOSIS — E538 Deficiency of other specified B group vitamins: Secondary | ICD-10-CM

## 2022-03-14 DIAGNOSIS — E559 Vitamin D deficiency, unspecified: Secondary | ICD-10-CM

## 2022-03-14 DIAGNOSIS — I10 Essential (primary) hypertension: Secondary | ICD-10-CM

## 2022-03-14 DIAGNOSIS — E782 Mixed hyperlipidemia: Secondary | ICD-10-CM

## 2022-03-14 DIAGNOSIS — F419 Anxiety disorder, unspecified: Secondary | ICD-10-CM

## 2022-03-14 NOTE — Telephone Encounter (Signed)
Patient called and her appointment is this Thursday, could you please put her lab orders in so they can come by and pick them up.  They go to [facility]

## 2022-03-16 ENCOUNTER — Ambulatory Visit (RURAL_HEALTH_CENTER): Payer: Medicare Other | Attending: Family | Admitting: Family

## 2022-03-16 ENCOUNTER — Encounter (RURAL_HEALTH_CENTER): Payer: Self-pay | Admitting: Family

## 2022-03-16 ENCOUNTER — Other Ambulatory Visit: Payer: Self-pay

## 2022-03-16 VITALS — BP 146/68 | HR 78 | Temp 97.8°F | Ht 66.0 in | Wt 243.0 lb

## 2022-03-16 DIAGNOSIS — E559 Vitamin D deficiency, unspecified: Secondary | ICD-10-CM | POA: Insufficient documentation

## 2022-03-16 DIAGNOSIS — I10 Essential (primary) hypertension: Secondary | ICD-10-CM | POA: Insufficient documentation

## 2022-03-16 DIAGNOSIS — N3281 Overactive bladder: Secondary | ICD-10-CM

## 2022-03-16 DIAGNOSIS — K219 Gastro-esophageal reflux disease without esophagitis: Secondary | ICD-10-CM | POA: Insufficient documentation

## 2022-03-16 DIAGNOSIS — Z6839 Body mass index (BMI) 39.0-39.9, adult: Secondary | ICD-10-CM | POA: Insufficient documentation

## 2022-03-16 DIAGNOSIS — E119 Type 2 diabetes mellitus without complications: Secondary | ICD-10-CM | POA: Insufficient documentation

## 2022-03-16 DIAGNOSIS — E782 Mixed hyperlipidemia: Secondary | ICD-10-CM | POA: Insufficient documentation

## 2022-03-16 DIAGNOSIS — F419 Anxiety disorder, unspecified: Secondary | ICD-10-CM | POA: Insufficient documentation

## 2022-03-16 DIAGNOSIS — J3089 Other allergic rhinitis: Secondary | ICD-10-CM | POA: Insufficient documentation

## 2022-03-16 DIAGNOSIS — Z7409 Other reduced mobility: Secondary | ICD-10-CM | POA: Insufficient documentation

## 2022-03-16 DIAGNOSIS — M1990 Unspecified osteoarthritis, unspecified site: Secondary | ICD-10-CM

## 2022-03-16 DIAGNOSIS — Z7984 Long term (current) use of oral hypoglycemic drugs: Secondary | ICD-10-CM | POA: Insufficient documentation

## 2022-03-16 DIAGNOSIS — E538 Deficiency of other specified B group vitamins: Secondary | ICD-10-CM | POA: Insufficient documentation

## 2022-03-16 NOTE — Progress Notes (Addendum)
FAMILY MEDICINE, [facility]  [address]  [place] [address]  Operated by [facility]     Name: Amber Owens MRN:  [id number]   Date of Birth: [birth year] Age: 66 y.o.   Date: 03/16/2022  Time: 12:12     Provider: Stormy Fabian, NP-C    Reason for visit: Follow Up 3 Months (She is complaining of poor mobility and getting worse )      History of Present Illness:  Amber Owens is a 66 y.o. female presenting with chronic disease management.      Patient Active Problem List    Diagnosis Date Noted    Osteoarthritis (arthritis due to wear and tear of joints) 04/03/2022    Impaired functional mobility, balance, gait, and endurance 03/16/2022    Morbid obesity (CMS HCC) 12/12/2021    B12 deficiency 11/08/2021    Essential hypertension 08/01/2021    Mixed hyperlipidemia 08/01/2021    Gastroesophageal reflux disease without esophagitis 08/01/2021    Anxiety 08/01/2021    Environmental and seasonal allergies 08/01/2021    Vitamin D deficiency 08/01/2021    Type 2 diabetes mellitus without complication, without long-term current use of insulin (CMS HCC) 06/27/2018     Dx 2013.  Last eye exam 12/18/20, Dr. Micheline Rough.      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    OAB (overactive bladder) 06/27/2018    Chronic constipation 06/27/2018       Historical Data    Past Medical History:  Past Medical History:   Diagnosis Date    Abdominal pain     Anxiety     B12 deficiency     Chronic back pain     Chronic left hip pain     COVID     Cystocele with rectocele     Diabetes mellitus, type 2 (CMS HCC)     Diabetic neuropathy, type II diabetes mellitus (CMS HCC)     Encounter for screening for malignant neoplasm of breast     Esophageal reflux     Female bladder prolapse     Hypercholesterolemia     Hypertension     Intermittent claudication (CMS HCC)     Morbid obesity with BMI of 40.0-44.9, adult (CMS HCC)     Osteoarthritis of spinal facet joint     Steatosis of liver     Vitamin D  deficiency      Past Surgical History:  Past Surgical History:   Procedure Laterality Date    BLADDER SURGERY      prolapse bladder    COLONOSCOPY      HX APPENDECTOMY      HX HYSTERECTOMY      HX TUBAL LIGATION      REPAIR RECTOCELE       Allergies:  No Known Allergies  Medications:  Current Outpatient Medications   Medication Sig    aspirin (ECOTRIN) 81 mg Oral Tablet, Delayed Release (E.C.) Take 1 Tablet (81 mg total) by mouth Once a day    cholecalciferol, vitamin D3, 25 mcg (1,000 unit) Oral Tablet Take 1 Tablet (1,000 Units total) by mouth Once a day    collagen/biotin/ascorbic acid (COLLAGEN 1500 PLUS C ORAL) Take 1,000 mg by mouth    cyanocobalamin (VITAMIN B 12) 1,000 mcg Oral Tablet Take 1 Tablet (1,000 mcg total) by mouth Once a day    losartan (COZAAR) 25 mg Oral Tablet TAKE 1 TABLET (25 MG TOTAL) BY MOUTH ONCE A  DAY FOR BLOOD PRESSURE    magnesium chloride (SLOW-MAG) 64 mg Oral Tablet, Delayed Release (E.C.) Take 1 Tablet (64 mg total) by mouth    metFORMIN (GLUCOPHAGE) 500 mg Oral Tablet TAKE 2 TABLETS BY MOUTH TWICE A DAY    multivit-min-mfolate-[id number]-herb289 (ALIVE WOMEN'S 50 PLUS ULTRA) 800 mcg DFE- 150 mcg Oral Tablet Take 1 Tablet by mouth Once a day    omega-3 fatty acid (LOVAZA) 1 gram Oral Capsule TAKE 2 CAPSULES (2 G TOTAL) BY MOUTH TWICE DAILY    omeprazole (PRILOSEC) 20 mg Oral Capsule, Delayed Release(E.C.) Take 1 Capsule (20 mg total) by mouth Once a day    pioglitazone (ACTOS) 30 mg Oral Tablet TAKE 1 TABLET (30 MG TOTAL) BY MOUTH ONCE A DAY FOR 90 DAYS    potassium gluconate 600 mg (99 mg) Oral Tablet Take 1 Tablet by mouth    rosuvastatin (CRESTOR) 20 mg Oral Tablet TAKE 1 TABLET BY MOUTH ONCE A DAY     Family History:  Family Medical History:       Problem Relation (Age of Onset)    Breast Cancer Maternal Aunt    No Known Problems Mother, Father, Sister, Brother, Maternal Grandmother, Maternal Grandfather, Paternal Grandmother, Paternal Grandfather, Daughter, Son, Maternal Uncle, Paternal  Aunt, Paternal Education officer, community, Other            Social History:  Social History     Socioeconomic History    Marital status: Married     Spouse name: Benna Arno    Number of children: 1   Tobacco Use    Smoking status: Never    Smokeless tobacco: Never   Substance and Sexual Activity    Alcohol use: Never    Drug use: Never     Social Determinants of Health     Financial Resource Strain: Low Risk  (12/12/2021)    Financial Resource Strain     SDOH Financial: No   Transportation Owens: Low Risk  (12/12/2021)    Transportation Owens     SDOH Transportation: No   Social Connections: Low Risk  (12/12/2021)    Social Connections     SDOH Social Isolation: 5 or more times a week   Intimate Partner Violence: Low Risk  (12/12/2021)    Intimate Partner Violence     SDOH Domestic Violence: No   Housing Stability: Low Risk  (12/12/2021)    Housing Stability     SDOH Housing Situation: I have housing.     SDOH Housing Worry: No           Review of Systems:  Any pertinent Review of Systems as addressed in the HPI above.    Physical Exam:  Vital Signs:  Vitals:    03/16/22 1324   BP: (!) 146/68   Pulse: 78   Temp: 36.6 C (97.8 F)   SpO2: 97%   Weight: 110 kg (243 lb)   Height: 1.676 m (5\' 6" )   BMI: 39.3     Physical Exam  Vitals reviewed.   Constitutional:       Appearance: She is obese.   HENT:      Right Ear: Tympanic membrane, ear canal and external ear normal.      Left Ear: Tympanic membrane, ear canal and external ear normal.      Nose:      Right Turbinates: Swollen.      Left Turbinates: Enlarged and swollen.   Cardiovascular:      Rate and Rhythm: Normal rate  and regular rhythm.      Pulses: Normal pulses.      Heart sounds: Normal heart sounds, S1 normal and S2 normal.   Pulmonary:      Effort: Pulmonary effort is normal.      Breath sounds: Normal breath sounds.   Abdominal:      General: Bowel sounds are normal.   Musculoskeletal:      Cervical back: Neck supple.      Right lower leg: No edema.      Left lower leg: No edema.    Skin:     General: Skin is warm.   Neurological:      General: No focal deficit present.      Mental Status: She is alert and oriented to person, place, and time.      Cranial Nerves: Cranial nerves 2-12 are intact.      Motor: Motor function is intact.      Coordination: Coordination is intact.      Gait: Gait abnormal (ambulatory with cane).   Psychiatric:         Mood and Affect: Mood normal.         Behavior: Behavior normal. Behavior is cooperative.         Thought Content: Thought content normal.         Cognition and Memory: Cognition normal.         Judgment: Judgment normal.          Assessment/Plan:  (E11.9) Type 2 diabetes mellitus without complication, without long-term current use of insulin (CMS HCC)  (primary encounter diagnosis)  Plan: HGA1C (HEMOGLOBIN A1C WITH EST AVG GLUCOSE),         MICROALBUMIN/CREATININE RATIO, URINE, RANDOM,         CBC, COMPREHENSIVE METABOLIC PNL, FASTING,         LIPID PANEL, MAGNESIUM, THYROID STIMULATING         HORMONE (SENSITIVE TSH), VITAMIN D 25 TOTAL,         VITAMIN B12    (I10) Essential hypertension  Plan: HGA1C (HEMOGLOBIN A1C WITH EST AVG GLUCOSE),         MICROALBUMIN/CREATININE RATIO, URINE, RANDOM,         CBC, COMPREHENSIVE METABOLIC PNL, FASTING,         LIPID PANEL, MAGNESIUM, THYROID STIMULATING         HORMONE (SENSITIVE TSH), VITAMIN D 25 TOTAL,         VITAMIN B12    (E78.2) Mixed hyperlipidemia  Plan: HGA1C (HEMOGLOBIN A1C WITH EST AVG GLUCOSE),         MICROALBUMIN/CREATININE RATIO, URINE, RANDOM,         CBC, COMPREHENSIVE METABOLIC PNL, FASTING,         LIPID PANEL, MAGNESIUM, THYROID STIMULATING         HORMONE (SENSITIVE TSH), VITAMIN D 25 TOTAL,         VITAMIN B12    (K21.9) Gastroesophageal reflux disease without esophagitis  Plan: HGA1C (HEMOGLOBIN A1C WITH EST AVG GLUCOSE),         MICROALBUMIN/CREATININE RATIO, URINE, RANDOM,         CBC, COMPREHENSIVE METABOLIC PNL, FASTING,         LIPID PANEL, MAGNESIUM, THYROID STIMULATING          HORMONE (SENSITIVE TSH), VITAMIN D 25 TOTAL,         VITAMIN B12    (E53.8) B12 deficiency  Plan: HGA1C (HEMOGLOBIN A1C WITH EST AVG GLUCOSE),  MICROALBUMIN/CREATININE RATIO, URINE, RANDOM,         CBC, COMPREHENSIVE METABOLIC PNL, FASTING,         LIPID PANEL, MAGNESIUM, THYROID STIMULATING         HORMONE (SENSITIVE TSH), VITAMIN D 25 TOTAL,         VITAMIN B12    (E55.9) Vitamin D deficiency  Plan: HGA1C (HEMOGLOBIN A1C WITH EST AVG GLUCOSE),         MICROALBUMIN/CREATININE RATIO, URINE, RANDOM,         CBC, COMPREHENSIVE METABOLIC PNL, FASTING,         LIPID PANEL, MAGNESIUM, THYROID STIMULATING         HORMONE (SENSITIVE TSH), VITAMIN D 25 TOTAL,         VITAMIN B12    (F41.9) Anxiety  Plan: HGA1C (HEMOGLOBIN A1C WITH EST AVG GLUCOSE),         MICROALBUMIN/CREATININE RATIO, URINE, RANDOM,         CBC, COMPREHENSIVE METABOLIC PNL, FASTING,         LIPID PANEL, MAGNESIUM, THYROID STIMULATING         HORMONE (SENSITIVE TSH), VITAMIN D 25 TOTAL,         VITAMIN B12    (J30.89) Environmental and seasonal allergies  Plan: HGA1C (HEMOGLOBIN A1C WITH EST AVG GLUCOSE),         MICROALBUMIN/CREATININE RATIO, URINE, RANDOM,         CBC, COMPREHENSIVE METABOLIC PNL, FASTING,         LIPID PANEL, MAGNESIUM, THYROID STIMULATING         HORMONE (SENSITIVE TSH), VITAMIN D 25 TOTAL,         VITAMIN B12    (E66.01) Morbid obesity (CMS HCC)  Plan: HGA1C (HEMOGLOBIN A1C WITH EST AVG GLUCOSE),         MICROALBUMIN/CREATININE RATIO, URINE, RANDOM,         CBC, COMPREHENSIVE METABOLIC PNL, FASTING,         LIPID PANEL, MAGNESIUM, THYROID STIMULATING         HORMONE (SENSITIVE TSH), VITAMIN D 25 TOTAL,         VITAMIN B12    (Z74.09) Impaired functional mobility, balance, gait, and endurance    (N32.81) OAB (overactive bladder)    (M19.90) Osteoarthritis (arthritis due to wear and tear of joints)       Problem List Items Addressed This Visit          Cardiovascular System    Essential hypertension     BP elevated today.   Patient reports she has been having back pain and going to [facility] chiropractor every 6 weeks.  Patient reports her blood pressure is normal at home.  Advised to continue to monitor BP. Continue current medications.         Relevant Orders    HGA1C (HEMOGLOBIN A1C WITH EST AVG GLUCOSE)    MICROALBUMIN/CREATININE RATIO, URINE, RANDOM    CBC    COMPREHENSIVE METABOLIC PNL, FASTING    LIPID PANEL    MAGNESIUM    THYROID STIMULATING HORMONE (SENSITIVE TSH)    VITAMIN D 25 TOTAL    VITAMIN B12    Mixed hyperlipidemia     Continue Crestor 20 mg daily.  Tolerating well.         Relevant Orders    HGA1C (HEMOGLOBIN A1C WITH EST AVG GLUCOSE)    MICROALBUMIN/CREATININE RATIO, URINE, RANDOM    CBC    COMPREHENSIVE METABOLIC PNL, FASTING  LIPID PANEL    MAGNESIUM    THYROID STIMULATING HORMONE (SENSITIVE TSH)    VITAMIN D 25 TOTAL    VITAMIN B12       Nephrology    OAB (overactive bladder)     Symptoms well controlled at this time.            Digestive    Gastroesophageal reflux disease without esophagitis     Symptoms well controlled.  Continue Prilosec 20 mg daily.         Relevant Orders    HGA1C (HEMOGLOBIN A1C WITH EST AVG GLUCOSE)    MICROALBUMIN/CREATININE RATIO, URINE, RANDOM    CBC    COMPREHENSIVE METABOLIC PNL, FASTING    LIPID PANEL    MAGNESIUM    THYROID STIMULATING HORMONE (SENSITIVE TSH)    VITAMIN D 25 TOTAL    VITAMIN B12       Endocrine    Type 2 diabetes mellitus without complication, without long-term current use of insulin (CMS HCC) - Primary     Last eye exam 12/18/2020 by Dr. Micheline Rough.  Continue metformin 1000 mg b.i.d. and Actos 30 mg daily.  Patient does not monitor glucose levels at home.  And has a diabetic diet, 20-30 minutes of daily exercise and continue weight loss efforts.         Relevant Orders    HGA1C (HEMOGLOBIN A1C WITH EST AVG GLUCOSE)    MICROALBUMIN/CREATININE RATIO, URINE, RANDOM    CBC    COMPREHENSIVE METABOLIC PNL, FASTING    LIPID PANEL    MAGNESIUM    THYROID STIMULATING HORMONE  (SENSITIVE TSH)    VITAMIN D 25 TOTAL    VITAMIN B12    Vitamin D deficiency     Continue vitamin-D 4000 IU daily and over-the-counter calcium 1200 mg daily.         Relevant Orders    HGA1C (HEMOGLOBIN A1C WITH EST AVG GLUCOSE)    MICROALBUMIN/CREATININE RATIO, URINE, RANDOM    CBC    COMPREHENSIVE METABOLIC PNL, FASTING    LIPID PANEL    MAGNESIUM    THYROID STIMULATING HORMONE (SENSITIVE TSH)    VITAMIN D 25 TOTAL    VITAMIN B12    B12 deficiency     Continue vitamin B12         Relevant Orders    HGA1C (HEMOGLOBIN A1C WITH EST AVG GLUCOSE)    MICROALBUMIN/CREATININE RATIO, URINE, RANDOM    CBC    COMPREHENSIVE METABOLIC PNL, FASTING    LIPID PANEL    MAGNESIUM    THYROID STIMULATING HORMONE (SENSITIVE TSH)    VITAMIN D 25 TOTAL    VITAMIN B12       Musculoskeletal    Osteoarthritis (arthritis due to wear and tear of joints)     Managed by Dr. Lindwood Qua.            Psychiatric    Anxiety    Relevant Orders    HGA1C (HEMOGLOBIN A1C WITH EST AVG GLUCOSE)    MICROALBUMIN/CREATININE RATIO, URINE, RANDOM    CBC    COMPREHENSIVE METABOLIC PNL, FASTING    LIPID PANEL    MAGNESIUM    THYROID STIMULATING HORMONE (SENSITIVE TSH)    VITAMIN D 25 TOTAL    VITAMIN B12       Other    Environmental and seasonal allergies    Relevant Orders    HGA1C (HEMOGLOBIN A1C WITH EST AVG GLUCOSE)    MICROALBUMIN/CREATININE RATIO, URINE, RANDOM  CBC    COMPREHENSIVE METABOLIC PNL, FASTING    LIPID PANEL    MAGNESIUM    THYROID STIMULATING HORMONE (SENSITIVE TSH)    VITAMIN D 25 TOTAL    VITAMIN B12    Morbid obesity (CMS HCC)    Relevant Orders    HGA1C (HEMOGLOBIN A1C WITH EST AVG GLUCOSE)    MICROALBUMIN/CREATININE RATIO, URINE, RANDOM    CBC    COMPREHENSIVE METABOLIC PNL, FASTING    LIPID PANEL    MAGNESIUM    THYROID STIMULATING HORMONE (SENSITIVE TSH)    VITAMIN D 25 TOTAL    VITAMIN B12    Impaired functional mobility, balance, gait, and endurance     Patient reports chronic back problems.  Currently under the care [facility]ropract[facility] every 6 weeks for back pain.  Reports she completed outpatient physical therapy without relief of symptoms.  Ambulatory with cane.            Labs drawn today.  Continue current medications.  Advised a low-fat/low sodium diet, advised 150 minutes of scheduled weekly physical activity as tolerated.  Advised to maintain a healthy weight.   Return in about 3 months (around 06/16/2022) for routine visit; schedule medicare wellness visit.    Stormy Fabian, NP-C     Portions of this note may be dictated using voice recognition software or a dictation service. Variances in spelling and vocabulary are possible and unintentional. Not all errors are caught/corrected. Please notify the [facility]in if any discrepancies are noted or if the meaning of any statement is not clear.

## 2022-04-03 ENCOUNTER — Encounter (RURAL_HEALTH_CENTER): Payer: Self-pay | Admitting: Family

## 2022-04-03 ENCOUNTER — Telehealth (RURAL_HEALTH_CENTER): Payer: Self-pay | Admitting: Family

## 2022-04-03 DIAGNOSIS — M19112 Post-traumatic osteoarthritis, left shoulder: Secondary | ICD-10-CM | POA: Insufficient documentation

## 2022-04-03 DIAGNOSIS — M1990 Unspecified osteoarthritis, unspecified site: Secondary | ICD-10-CM | POA: Insufficient documentation

## 2022-04-03 NOTE — Assessment & Plan Note (Signed)
Patient reports chronic back problems.  Currently under the care [facility] every 6 weeks for back pain.  Reports she completed outpatient physical therapy without relief of symptoms.  Ambulatory with cane.

## 2022-04-03 NOTE — Assessment & Plan Note (Signed)
Continue vitamin B12. 

## 2022-04-03 NOTE — Assessment & Plan Note (Signed)
Last eye exam 12/18/2020 by Dr. Toribio Harbour.  Continue metformin 1000 mg b.i.d. and Actos 30 mg daily.  Patient does not monitor glucose levels at home.  And has a diabetic diet, 20-30 minutes of daily exercise and continue weight loss efforts.

## 2022-04-03 NOTE — Assessment & Plan Note (Signed)
Managed by Dr. Branson

## 2022-04-03 NOTE — Assessment & Plan Note (Signed)
Continue Crestor 20 mg daily.  Tolerating well.

## 2022-04-03 NOTE — Assessment & Plan Note (Signed)
Continue vitamin-D 4000 IU daily and over-the-counter calcium 1200 mg daily.

## 2022-04-03 NOTE — Assessment & Plan Note (Signed)
BP elevated today.  Patient reports she has been having back pain and going to [facility] chiropractor every 6 weeks.  Patient reports her blood pressure is normal at home.  Advised to continue to monitor BP. Continue current medications.

## 2022-04-03 NOTE — Assessment & Plan Note (Signed)
Symptoms well controlled at this time.

## 2022-04-03 NOTE — Assessment & Plan Note (Signed)
Symptoms well controlled.  Continue Prilosec 20 mg daily.

## 2022-06-19 ENCOUNTER — Ambulatory Visit (RURAL_HEALTH_CENTER): Payer: Medicare Other | Attending: Family | Admitting: Family

## 2022-06-19 ENCOUNTER — Other Ambulatory Visit: Payer: Self-pay

## 2022-06-19 ENCOUNTER — Other Ambulatory Visit (RURAL_HEALTH_CENTER): Payer: Self-pay | Admitting: Family

## 2022-06-19 ENCOUNTER — Encounter (RURAL_HEALTH_CENTER): Payer: Self-pay | Admitting: Family

## 2022-06-19 VITALS — BP 139/75 | HR 75 | Temp 98.0°F | Resp 16 | Ht 65.0 in | Wt 245.4 lb

## 2022-06-19 DIAGNOSIS — Z7984 Long term (current) use of oral hypoglycemic drugs: Secondary | ICD-10-CM | POA: Insufficient documentation

## 2022-06-19 DIAGNOSIS — J3089 Other allergic rhinitis: Secondary | ICD-10-CM

## 2022-06-19 DIAGNOSIS — D649 Anemia, unspecified: Secondary | ICD-10-CM

## 2022-06-19 DIAGNOSIS — E119 Type 2 diabetes mellitus without complications: Secondary | ICD-10-CM

## 2022-06-19 DIAGNOSIS — D509 Iron deficiency anemia, unspecified: Secondary | ICD-10-CM

## 2022-06-19 DIAGNOSIS — I10 Essential (primary) hypertension: Secondary | ICD-10-CM

## 2022-06-19 DIAGNOSIS — E559 Vitamin D deficiency, unspecified: Secondary | ICD-10-CM

## 2022-06-19 DIAGNOSIS — E782 Mixed hyperlipidemia: Secondary | ICD-10-CM

## 2022-06-19 DIAGNOSIS — Z7409 Other reduced mobility: Secondary | ICD-10-CM

## 2022-06-19 DIAGNOSIS — K219 Gastro-esophageal reflux disease without esophagitis: Secondary | ICD-10-CM

## 2022-06-19 DIAGNOSIS — E538 Deficiency of other specified B group vitamins: Secondary | ICD-10-CM

## 2022-06-19 DIAGNOSIS — F419 Anxiety disorder, unspecified: Secondary | ICD-10-CM

## 2022-06-19 DIAGNOSIS — Z6841 Body Mass Index (BMI) 40.0 and over, adult: Secondary | ICD-10-CM | POA: Insufficient documentation

## 2022-06-19 DIAGNOSIS — M1990 Unspecified osteoarthritis, unspecified site: Secondary | ICD-10-CM

## 2022-06-19 HISTORY — DX: Iron deficiency anemia, unspecified: D50.9

## 2022-06-19 MED ORDER — METFORMIN 500 MG TABLET
1000.0000 mg | ORAL_TABLET | Freq: Two times a day (BID) | ORAL | 1 refills | Status: DC
Start: 2022-06-19 — End: 2022-06-19

## 2022-06-19 MED ORDER — PIOGLITAZONE 30 MG TABLET
30.0000 mg | ORAL_TABLET | Freq: Every day | ORAL | 0 refills | Status: DC
Start: 2022-06-19 — End: 2022-12-14

## 2022-06-19 MED ORDER — LOSARTAN 25 MG TABLET
25.0000 mg | ORAL_TABLET | Freq: Every day | ORAL | 1 refills | Status: DC
Start: 2022-06-19 — End: 2022-12-27

## 2022-06-19 MED ORDER — OMEPRAZOLE 20 MG CAPSULE,DELAYED RELEASE
20.0000 mg | DELAYED_RELEASE_CAPSULE | Freq: Every day | ORAL | 1 refills | Status: DC
Start: 2022-06-19 — End: 2023-04-18

## 2022-06-19 MED ORDER — OMEGA-3 ACID ETHYL ESTERS 1 GRAM CAPSULE
2.0000 g | ORAL_CAPSULE | Freq: Two times a day (BID) | ORAL | 1 refills | Status: DC
Start: 2022-06-19 — End: 2022-12-27

## 2022-06-19 MED ORDER — ROSUVASTATIN 20 MG TABLET
20.0000 mg | ORAL_TABLET | Freq: Every day | ORAL | 1 refills | Status: DC
Start: 2022-06-19 — End: 2022-12-27

## 2022-06-19 MED ORDER — CYANOCOBALAMIN (VIT B-12) 1,000 MCG TABLET
1000.0000 ug | ORAL_TABLET | Freq: Every day | ORAL | 1 refills | Status: AC
Start: 2022-06-19 — End: ?

## 2022-06-19 MED ORDER — MAGNESIUM 64 MG (MAGNESIUM CHLORIDE) TABLET,DELAYED RELEASE
64.0000 mg | DELAYED_RELEASE_TABLET | Freq: Every day | ORAL | 1 refills | Status: AC
Start: 2022-06-19 — End: ?

## 2022-06-19 MED ORDER — METFORMIN 500 MG TABLET
1000.0000 mg | ORAL_TABLET | Freq: Two times a day (BID) | ORAL | 0 refills | Status: DC
Start: 2022-06-19 — End: 2022-12-27

## 2022-06-19 MED ORDER — POTASSIUM GLUCONATE 600 MG (99 MG) TABLET
1.0000 | ORAL_TABLET | Freq: Every day | ORAL | 1 refills | Status: DC
Start: 2022-06-19 — End: 2022-10-03

## 2022-06-19 NOTE — Assessment & Plan Note (Signed)
Continue Lovaza and Crestor 20 mg daily.  Advised to limit high fat foods and processed foods in diet.Marland Kitchen

## 2022-06-19 NOTE — Assessment & Plan Note (Signed)
Discussed fall risk.  Denies history of falls.  Patient declined PT/OT.

## 2022-06-19 NOTE — Assessment & Plan Note (Signed)
Declined PT.  Advised over-the-counter Tylenol Arthritis as needed.

## 2022-06-19 NOTE — Assessment & Plan Note (Signed)
Continue vitamin-D 4000 IU daily

## 2022-06-19 NOTE — Assessment & Plan Note (Signed)
Continue vitamin B12 1000 mcg daily.

## 2022-06-19 NOTE — Assessment & Plan Note (Signed)
Symptoms well controlled on omeprazole 20 mg daily.

## 2022-06-19 NOTE — Assessment & Plan Note (Signed)
Further treatment pending iron panel.

## 2022-06-19 NOTE — Assessment & Plan Note (Signed)
BP well controlled.  Continue current medications.

## 2022-06-19 NOTE — Assessment & Plan Note (Signed)
Discussed diet and exercise 

## 2022-06-19 NOTE — Assessment & Plan Note (Signed)
A1c 6.0.  Continue current medications.  Advised a diabetic diet and daily physical exercise.

## 2022-06-19 NOTE — Progress Notes (Signed)
FAMILY MEDICINE, [place]  [place] [id number]  Operated by [facility]     Name: Amber Owens MRN:  [id number]   Date of Birth: Mar 03, 1956 Age: 67 y.o.   Date: 06/19/2022  Time: 14:33     Provider: Jodi Mourning, NP-C    Reason for visit: Follow Up 3 Months      History of Present Illness:  Amber Owens is a 67 y.o. female presenting with chronic disease management.      Patient Active Problem List    Diagnosis Date Noted    Low hemoglobin 06/19/2022    Hypomagnesemia 06/19/2022     Rx:  Magnesium 64 mg daily.      Osteoarthritis (arthritis due to wear and tear of joints) 04/03/2022    Impaired functional mobility, balance, gait, and endurance 03/16/2022     Ambulatory without assistance.      Morbid obesity (CMS [place]) 12/12/2021    B12 deficiency 11/08/2021    Essential hypertension 08/01/2021    Mixed hyperlipidemia 08/01/2021    Gastroesophageal reflux disease without esophagitis 08/01/2021    Environmental and seasonal allergies 08/01/2021     Advised and over-the-counter antihistamine.  Patient declined respiratory screenings.       Vitamin D deficiency 08/01/2021     Vitamin-4000 IU daily and OTC calcium 1200 mg daily.      Type 2 diabetes mellitus without complication, without long-term current use of insulin (CMS [place]) 06/27/2018     Dx 2013.  Last eye exam 12/18/20, Dr. Toribio Harbour.         Historical Data    Past Medical History:  Past Medical History:   Diagnosis Date    Abdominal pain     Anxiety     Anxiety     B12 deficiency     Chronic back pain     Chronic constipation     Chronic left hip pain     COVID     Cystocele with rectocele     Diabetes mellitus, type 2 (CMS HCC)     Diabetic neuropathy, type II diabetes mellitus (CMS HCC)     Encounter for screening for malignant neoplasm of breast     Esophageal reflux     Female bladder prolapse     Hypercholesterolemia     Hypertension     Intermittent claudication (CMS HCC)     Morbid  obesity with BMI of 40.0-44.9, adult (CMS HCC)     OAB (overactive bladder)     Osteoarthritis of spinal facet joint     Steatosis of liver     Vaginal vault prolapse     Vitamin D deficiency      Past Surgical History:  Past Surgical History:   Procedure Laterality Date    BLADDER SURGERY      prolapse bladder    COLONOSCOPY      HX APPENDECTOMY      HX HYSTERECTOMY      HX TUBAL LIGATION      REPAIR RECTOCELE       Allergies:  No Known Allergies  Medications:  Current Outpatient Medications   Medication Sig    aspirin (ECOTRIN) 81 mg Oral Tablet, Delayed Release (E.C.) Take 1 Tablet (81 mg total) by mouth Once a day    cholecalciferol, vitamin D3, 25 mcg (1,000 unit) Oral Tablet Take 1 Tablet (1,000 Units total) by mouth Once a day  collagen/biotin/ascorbic acid (COLLAGEN 1500 PLUS C ORAL) Take 1,000 mg by mouth    cyanocobalamin (VITAMIN B 12) 1,000 mcg Oral Tablet Take 1 Tablet (1,000 mcg total) by mouth Once a day    losartan (COZAAR) 25 mg Oral Tablet Take 1 Tablet (25 mg total) by mouth Once a day for blood pressure    magnesium chloride (SLOW-MAG) 64 mg Oral Tablet, Delayed Release (E.C.) Take 1 Tablet (64 mg total) by mouth Once a day    metFORMIN (GLUCOPHAGE) 500 mg Oral Tablet Take 2 Tablets (1,000 mg total) by mouth Twice daily    multivit-min-mfolate-K-herb289 (ALIVE WOMEN'S 50 PLUS ULTRA) 800 mcg DFE- 150 mcg Oral Tablet Take 1 Tablet by mouth Once a day    omega-3 fatty acid (LOVAZA) 1 gram Oral Capsule Take 2 Capsules (2 g total) by mouth Twice daily    omeprazole (PRILOSEC) 20 mg Oral Capsule, Delayed Release(E.C.) Take 1 Capsule (20 mg total) by mouth Once a day    pioglitazone (ACTOS) 30 mg Oral Tablet Take 1 Tablet (30 mg total) by mouth Once a day for 90 days    potassium gluconate 600 mg (99 mg) Oral Tablet Take 1 Tablet by mouth Once a day    rosuvastatin (CRESTOR) 20 mg Oral Tablet Take 1 Tablet (20 mg total) by mouth Once a day     Family History:  Family Medical History:       Problem  Relation (Age of Onset)    Breast Cancer Maternal Aunt    No Known Problems Mother, Father, Sister, Brother, Maternal Grandmother, Maternal Grandfather, Paternal Grandmother, Paternal Grandfather, Daughter, Son, Maternal Uncle, Paternal 33, Paternal Interior and spatial designer, Other            Social History:  Social History     Socioeconomic History    Marital status: Married     Spouse name: Chrystyna Peary    Number of children: 1   Tobacco Use    Smoking status: Never    Smokeless tobacco: Never   Substance and Sexual Activity    Alcohol use: Never    Drug use: Never     Social Determinants of Health     Financial Resource Strain: Low Risk  (06/19/2022)    Financial Resource Strain     SDOH Financial: No   Transportation Needs: Low Risk  (06/19/2022)    Transportation Needs     SDOH Transportation: No   Social Connections: Low Risk  (06/19/2022)    Social Connections     SDOH Social Isolation: 5 or more times a week   Intimate Partner Violence: Low Risk  (06/19/2022)    Intimate Partner Violence     SDOH Domestic Violence: No   Housing Stability: Low Risk  (06/19/2022)    Housing Stability     SDOH Housing Situation: I have housing.     SDOH Housing Worry: No           Review of Systems:  Any pertinent Review of Systems as addressed in the HPI above.    Physical Exam:  Vital Signs:  Vitals:    06/19/22 1314   BP: 139/75   Pulse: 75   Resp: 16   Temp: 36.7 C (98 F)   SpO2: 96%   Weight: 111 kg (245 lb 6.4 oz)   Height: 1.651 m (5' 5"$ )   BMI: 40.92     Physical Exam  Vitals reviewed.   Constitutional:       Appearance: She is obese.  HENT:      Right Ear: Tympanic membrane, ear canal and external ear normal.      Left Ear: Tympanic membrane, ear canal and external ear normal.      Nose: Rhinorrhea present. Rhinorrhea is clear.      Right Turbinates: Pale.      Left Turbinates: Pale.      Mouth/Throat:      Mouth: Mucous membranes are moist.      Pharynx: Oropharynx is clear.   Cardiovascular:      Rate and Rhythm: Normal rate and regular  rhythm.      Pulses: Normal pulses.      Heart sounds: Normal heart sounds, S1 normal and S2 normal.   Pulmonary:      Effort: Pulmonary effort is normal.      Breath sounds: Normal breath sounds.   Abdominal:      General: Bowel sounds are normal.   Musculoskeletal:      Cervical back: Neck supple.      Right lower leg: No edema.      Left lower leg: No edema.   Lymphadenopathy:      Cervical: No cervical adenopathy.   Skin:     General: Skin is warm.   Neurological:      General: No focal deficit present.      Mental Status: She is alert and oriented to person, place, and time.      Cranial Nerves: Cranial nerves 2-12 are intact.      Motor: Motor function is intact.      Coordination: Coordination is intact.      Gait: Gait normal.   Psychiatric:         Mood and Affect: Mood normal.         Behavior: Behavior normal. Behavior is cooperative.         Thought Content: Thought content normal.         Cognition and Memory: Cognition normal.         Judgment: Judgment normal.          Assessment/Plan:  (E11.9) Type 2 diabetes mellitus without complication, without long-term current use of insulin (CMS HCC)  (primary encounter diagnosis)  Plan: HGA1C (HEMOGLOBIN A1C WITH EST AVG GLUCOSE),         MICROALBUMIN/CREATININE RATIO, URINE, RANDOM,         CBC, COMPREHENSIVE METABOLIC PNL, FASTING,         LIPID PANEL, MAGNESIUM, THYROID STIMULATING         HORMONE (SENSITIVE TSH), VITAMIN D 25 TOTAL,         VITAMIN B12, HGA1C (HEMOGLOBIN A1C WITH EST AVG        GLUCOSE), MICROALBUMIN/CREATININE RATIO, URINE,        RANDOM, CBC, COMPREHENSIVE METABOLIC PNL,         FASTING, LIPID PANEL, MAGNESIUM, THYROID         STIMULATING HORMONE (SENSITIVE TSH), VITAMIN D         25 TOTAL, VITAMIN B12    (I10) Essential hypertension  Plan: HGA1C (HEMOGLOBIN A1C WITH EST AVG GLUCOSE),         MICROALBUMIN/CREATININE RATIO, URINE, RANDOM,         CBC, COMPREHENSIVE METABOLIC PNL, FASTING,         LIPID PANEL, MAGNESIUM, THYROID  STIMULATING         HORMONE (SENSITIVE TSH), VITAMIN D 25 TOTAL,         VITAMIN B12, HGA1C (  HEMOGLOBIN A1C WITH EST AVG        GLUCOSE), MICROALBUMIN/CREATININE RATIO, URINE,        RANDOM, CBC, COMPREHENSIVE METABOLIC PNL,         FASTING, LIPID PANEL, MAGNESIUM, THYROID         STIMULATING HORMONE (SENSITIVE TSH), VITAMIN D         25 TOTAL, VITAMIN B12    (E78.2) Mixed hyperlipidemia    (K21.9) Gastroesophageal reflux disease without esophagitis  Plan: HGA1C (HEMOGLOBIN A1C WITH EST AVG GLUCOSE),         MICROALBUMIN/CREATININE RATIO, URINE, RANDOM,         CBC, COMPREHENSIVE METABOLIC PNL, FASTING,         LIPID PANEL, MAGNESIUM, THYROID STIMULATING         HORMONE (SENSITIVE TSH), VITAMIN D 25 TOTAL,         VITAMIN B12, HGA1C (HEMOGLOBIN A1C WITH EST AVG        GLUCOSE), MICROALBUMIN/CREATININE RATIO, URINE,        RANDOM, CBC, COMPREHENSIVE METABOLIC PNL,         FASTING, LIPID PANEL, MAGNESIUM, THYROID         STIMULATING HORMONE (SENSITIVE TSH), VITAMIN D         25 TOTAL, VITAMIN B12    (F41.9) Anxiety  Plan: HGA1C (HEMOGLOBIN A1C WITH EST AVG GLUCOSE),         MICROALBUMIN/CREATININE RATIO, URINE, RANDOM,         CBC, COMPREHENSIVE METABOLIC PNL, FASTING,         LIPID PANEL, MAGNESIUM, THYROID STIMULATING         HORMONE (SENSITIVE TSH), VITAMIN D 25 TOTAL,         VITAMIN B12, HGA1C (HEMOGLOBIN A1C WITH EST AVG        GLUCOSE), MICROALBUMIN/CREATININE RATIO, URINE,        RANDOM, CBC, COMPREHENSIVE METABOLIC PNL,         FASTING, LIPID PANEL, MAGNESIUM, THYROID         STIMULATING HORMONE (SENSITIVE TSH), VITAMIN D         25 TOTAL, VITAMIN B12    (J30.89) Environmental and seasonal allergies  Plan: HGA1C (HEMOGLOBIN A1C WITH EST AVG GLUCOSE),         MICROALBUMIN/CREATININE RATIO, URINE, RANDOM,         CBC, COMPREHENSIVE METABOLIC PNL, FASTING,         LIPID PANEL, MAGNESIUM, THYROID STIMULATING         HORMONE (SENSITIVE TSH), VITAMIN D 25 TOTAL,         VITAMIN B12, HGA1C (HEMOGLOBIN A1C WITH EST  AVG        GLUCOSE), MICROALBUMIN/CREATININE RATIO, URINE,        RANDOM, CBC, COMPREHENSIVE METABOLIC PNL,         FASTING, LIPID PANEL, MAGNESIUM, THYROID         STIMULATING HORMONE (SENSITIVE TSH), VITAMIN D         25 TOTAL, VITAMIN B12    (M19.90) Osteoarthritis (arthritis due to wear and tear of joints)    (Z74.09) Impaired functional mobility, balance, gait, and endurance  Plan: HGA1C (HEMOGLOBIN A1C WITH EST AVG GLUCOSE),         MICROALBUMIN/CREATININE RATIO, URINE, RANDOM,         CBC, COMPREHENSIVE METABOLIC PNL, FASTING,         LIPID PANEL, MAGNESIUM, THYROID STIMULATING         HORMONE (SENSITIVE TSH), VITAMIN D  25 TOTAL,         VITAMIN B12, HGA1C (HEMOGLOBIN A1C WITH EST AVG        GLUCOSE), MICROALBUMIN/CREATININE RATIO, URINE,        RANDOM, CBC, COMPREHENSIVE METABOLIC PNL,         FASTING, LIPID PANEL, MAGNESIUM, THYROID         STIMULATING HORMONE (SENSITIVE TSH), VITAMIN D         25 TOTAL, VITAMIN B12    (E66.01) Morbid obesity (CMS HCC)  Plan: HGA1C (HEMOGLOBIN A1C WITH EST AVG GLUCOSE),         MICROALBUMIN/CREATININE RATIO, URINE, RANDOM,         CBC, COMPREHENSIVE METABOLIC PNL, FASTING,         LIPID PANEL, MAGNESIUM, THYROID STIMULATING         HORMONE (SENSITIVE TSH), VITAMIN D 25 TOTAL,         VITAMIN B12, HGA1C (HEMOGLOBIN A1C WITH EST AVG        GLUCOSE), MICROALBUMIN/CREATININE RATIO, URINE,        RANDOM, CBC, COMPREHENSIVE METABOLIC PNL,         FASTING, LIPID PANEL, MAGNESIUM, THYROID         STIMULATING HORMONE (SENSITIVE TSH), VITAMIN D         25 TOTAL, VITAMIN B12    (E55.9) Vitamin D deficiency  Plan: HGA1C (HEMOGLOBIN A1C WITH EST AVG GLUCOSE),         MICROALBUMIN/CREATININE RATIO, URINE, RANDOM,         CBC, COMPREHENSIVE METABOLIC PNL, FASTING,         LIPID PANEL, MAGNESIUM, THYROID STIMULATING         HORMONE (SENSITIVE TSH), VITAMIN D 25 TOTAL,         VITAMIN B12, HGA1C (HEMOGLOBIN A1C WITH EST AVG        GLUCOSE), MICROALBUMIN/CREATININE RATIO, URINE,         RANDOM, CBC, COMPREHENSIVE METABOLIC PNL,         FASTING, LIPID PANEL, MAGNESIUM, THYROID         STIMULATING HORMONE (SENSITIVE TSH), VITAMIN D         25 TOTAL, VITAMIN B12    (E53.8) B12 deficiency  Plan: HGA1C (HEMOGLOBIN A1C WITH EST AVG GLUCOSE),         MICROALBUMIN/CREATININE RATIO, URINE, RANDOM,         CBC, COMPREHENSIVE METABOLIC PNL, FASTING,         LIPID PANEL, MAGNESIUM, THYROID STIMULATING         HORMONE (SENSITIVE TSH), VITAMIN D 25 TOTAL,         VITAMIN B12, HGA1C (HEMOGLOBIN A1C WITH EST AVG        GLUCOSE), MICROALBUMIN/CREATININE RATIO, URINE,        RANDOM, CBC, COMPREHENSIVE METABOLIC PNL,         FASTING, LIPID PANEL, MAGNESIUM, THYROID         STIMULATING HORMONE (SENSITIVE TSH), VITAMIN D         25 TOTAL, VITAMIN B12    (D64.9) Low hemoglobin  Plan: H & H, FERRITIN, IRON TRANSFERRIN AND TIBC,         CANCELED: FERRITIN, CANCELED: IRON TRANSFERRIN         AND TIBC, CANCELED: H & H    (E83.42) Hypomagnesemia         Problem List Items Addressed This Visit          Cardiovascular System    Essential hypertension  BP well controlled.  Continue current medications.         Relevant Orders    HGA1C (HEMOGLOBIN A1C WITH EST AVG GLUCOSE)    MICROALBUMIN/CREATININE RATIO, URINE, RANDOM    CBC    COMPREHENSIVE METABOLIC PNL, FASTING    LIPID PANEL    MAGNESIUM    THYROID STIMULATING HORMONE (SENSITIVE TSH)    VITAMIN D 25 TOTAL    VITAMIN B12    HGA1C (HEMOGLOBIN A1C WITH EST AVG GLUCOSE)    MICROALBUMIN/CREATININE RATIO, URINE, RANDOM    CBC    COMPREHENSIVE METABOLIC PNL, FASTING    LIPID PANEL    MAGNESIUM    THYROID STIMULATING HORMONE (SENSITIVE TSH)    VITAMIN D 25 TOTAL    VITAMIN B12    Mixed hyperlipidemia     Continue Lovaza and Crestor 20 mg daily.  Advised to limit high fat foods and processed foods in diet..            Digestive    Gastroesophageal reflux disease without esophagitis     Symptoms well controlled on omeprazole 20 mg daily.         Relevant Orders    HGA1C  (HEMOGLOBIN A1C WITH EST AVG GLUCOSE)    MICROALBUMIN/CREATININE RATIO, URINE, RANDOM    CBC    COMPREHENSIVE METABOLIC PNL, FASTING    LIPID PANEL    MAGNESIUM    THYROID STIMULATING HORMONE (SENSITIVE TSH)    VITAMIN D 25 TOTAL    VITAMIN B12    HGA1C (HEMOGLOBIN A1C WITH EST AVG GLUCOSE)    MICROALBUMIN/CREATININE RATIO, URINE, RANDOM    CBC    COMPREHENSIVE METABOLIC PNL, FASTING    LIPID PANEL    MAGNESIUM    THYROID STIMULATING HORMONE (SENSITIVE TSH)    VITAMIN D 25 TOTAL    VITAMIN B12       Endocrine    Type 2 diabetes mellitus without complication, without long-term current use of insulin (CMS HCC) - Primary     A1c 6.0.  Continue current medications.  Advised a diabetic diet and daily physical exercise.         Relevant Orders    HGA1C (HEMOGLOBIN A1C WITH EST AVG GLUCOSE)    MICROALBUMIN/CREATININE RATIO, URINE, RANDOM    CBC    COMPREHENSIVE METABOLIC PNL, FASTING    LIPID PANEL    MAGNESIUM    THYROID STIMULATING HORMONE (SENSITIVE TSH)    VITAMIN D 25 TOTAL    VITAMIN B12    HGA1C (HEMOGLOBIN A1C WITH EST AVG GLUCOSE)    MICROALBUMIN/CREATININE RATIO, URINE, RANDOM    CBC    COMPREHENSIVE METABOLIC PNL, FASTING    LIPID PANEL    MAGNESIUM    THYROID STIMULATING HORMONE (SENSITIVE TSH)    VITAMIN D 25 TOTAL    VITAMIN B12    Vitamin D deficiency     Continue vitamin-D 4000 IU daily.         Relevant Orders    HGA1C (HEMOGLOBIN A1C WITH EST AVG GLUCOSE)    MICROALBUMIN/CREATININE RATIO, URINE, RANDOM    CBC    COMPREHENSIVE METABOLIC PNL, FASTING    LIPID PANEL    MAGNESIUM    THYROID STIMULATING HORMONE (SENSITIVE TSH)    VITAMIN D 25 TOTAL    VITAMIN B12    HGA1C (HEMOGLOBIN A1C WITH EST AVG GLUCOSE)    MICROALBUMIN/CREATININE RATIO, URINE, RANDOM    CBC    COMPREHENSIVE METABOLIC PNL, FASTING    LIPID PANEL  MAGNESIUM    THYROID STIMULATING HORMONE (SENSITIVE TSH)    VITAMIN D 25 TOTAL    VITAMIN B12    B12 deficiency     Continue vitamin B12 1000 mcg daily.         Relevant Orders    HGA1C  (HEMOGLOBIN A1C WITH EST AVG GLUCOSE)    MICROALBUMIN/CREATININE RATIO, URINE, RANDOM    CBC    COMPREHENSIVE METABOLIC PNL, FASTING    LIPID PANEL    MAGNESIUM    THYROID STIMULATING HORMONE (SENSITIVE TSH)    VITAMIN D 25 TOTAL    VITAMIN B12    HGA1C (HEMOGLOBIN A1C WITH EST AVG GLUCOSE)    MICROALBUMIN/CREATININE RATIO, URINE, RANDOM    CBC    COMPREHENSIVE METABOLIC PNL, FASTING    LIPID PANEL    MAGNESIUM    THYROID STIMULATING HORMONE (SENSITIVE TSH)    VITAMIN D 25 TOTAL    VITAMIN B12       Hematologic/Lymphatic    Low hemoglobin     Further treatment pending iron panel.         Relevant Orders    H & H    FERRITIN    IRON TRANSFERRIN AND TIBC       Musculoskeletal    Osteoarthritis (arthritis due to wear and tear of joints)     Declined PT.  Advised over-the-counter Tylenol Arthritis as needed.            Psychiatric    RESOLVED: Anxiety    Relevant Orders    HGA1C (HEMOGLOBIN A1C WITH EST AVG GLUCOSE)    MICROALBUMIN/CREATININE RATIO, URINE, RANDOM    CBC    COMPREHENSIVE METABOLIC PNL, FASTING    LIPID PANEL    MAGNESIUM    THYROID STIMULATING HORMONE (SENSITIVE TSH)    VITAMIN D 25 TOTAL    VITAMIN B12    HGA1C (HEMOGLOBIN A1C WITH EST AVG GLUCOSE)    MICROALBUMIN/CREATININE RATIO, URINE, RANDOM    CBC    COMPREHENSIVE METABOLIC PNL, FASTING    LIPID PANEL    MAGNESIUM    THYROID STIMULATING HORMONE (SENSITIVE TSH)    VITAMIN D 25 TOTAL    VITAMIN B12       Other    Environmental and seasonal allergies    Relevant Orders    HGA1C (HEMOGLOBIN A1C WITH EST AVG GLUCOSE)    MICROALBUMIN/CREATININE RATIO, URINE, RANDOM    CBC    COMPREHENSIVE METABOLIC PNL, FASTING    LIPID PANEL    MAGNESIUM    THYROID STIMULATING HORMONE (SENSITIVE TSH)    VITAMIN D 25 TOTAL    VITAMIN B12    HGA1C (HEMOGLOBIN A1C WITH EST AVG GLUCOSE)    MICROALBUMIN/CREATININE RATIO, URINE, RANDOM    CBC    COMPREHENSIVE METABOLIC PNL, FASTING    LIPID PANEL    MAGNESIUM    THYROID STIMULATING HORMONE (SENSITIVE TSH)    VITAMIN D 25  TOTAL    VITAMIN B12    Morbid obesity (CMS HCC)     Discussed diet and exercise.         Relevant Orders    HGA1C (HEMOGLOBIN A1C WITH EST AVG GLUCOSE)    MICROALBUMIN/CREATININE RATIO, URINE, RANDOM    CBC    COMPREHENSIVE METABOLIC PNL, FASTING    LIPID PANEL    MAGNESIUM    THYROID STIMULATING HORMONE (SENSITIVE TSH)    VITAMIN D 25 TOTAL    VITAMIN B12    HGA1C (HEMOGLOBIN A1C WITH  EST AVG GLUCOSE)    MICROALBUMIN/CREATININE RATIO, URINE, RANDOM    CBC    COMPREHENSIVE METABOLIC PNL, FASTING    LIPID PANEL    MAGNESIUM    THYROID STIMULATING HORMONE (SENSITIVE TSH)    VITAMIN D 25 TOTAL    VITAMIN B12    Impaired functional mobility, balance, gait, and endurance     Discussed fall risk.  Denies history of falls.  Patient declined PT/OT.         Relevant Orders    HGA1C (HEMOGLOBIN A1C WITH EST AVG GLUCOSE)    MICROALBUMIN/CREATININE RATIO, URINE, RANDOM    CBC    COMPREHENSIVE METABOLIC PNL, FASTING    LIPID PANEL    MAGNESIUM    THYROID STIMULATING HORMONE (SENSITIVE TSH)    VITAMIN D 25 TOTAL    VITAMIN B12    HGA1C (HEMOGLOBIN A1C WITH EST AVG GLUCOSE)    MICROALBUMIN/CREATININE RATIO, URINE, RANDOM    CBC    COMPREHENSIVE METABOLIC PNL, FASTING    LIPID PANEL    MAGNESIUM    THYROID STIMULATING HORMONE (SENSITIVE TSH)    VITAMIN D 25 TOTAL    VITAMIN B12    Hypomagnesemia     Discussed labs s today.  Continue current medications as prescribed.  Advised a low-fat/low sodium diet, advised 150 minutes of scheduled weekly physical activity as tolerated.  Advised to maintain a healthy weight.   Return in about 3 months (around 09/17/2022) for routine visit.    Jodi Mourning, NP-C     Portions of this note may be dictated using voice recognition software or a dictation service. Variances in spelling and vocabulary are possible and unintentional. Not all errors are caught/corrected. Please notify the [facility] if any discrepancies are noted or if the meaning of any statement is not clear.

## 2022-06-20 ENCOUNTER — Telehealth (RURAL_HEALTH_CENTER): Payer: Self-pay | Admitting: Family

## 2022-06-20 ENCOUNTER — Other Ambulatory Visit (RURAL_HEALTH_CENTER): Payer: Self-pay | Admitting: Family

## 2022-06-20 DIAGNOSIS — D509 Iron deficiency anemia, unspecified: Secondary | ICD-10-CM

## 2022-06-20 MED ORDER — FERROUS SULFATE 324 MG (65 MG IRON) TABLET,DELAYED RELEASE
324.0000 mg | DELAYED_RELEASE_TABLET | Freq: Two times a day (BID) | ORAL | 1 refills | Status: DC
Start: 2022-06-20 — End: 2022-07-18

## 2022-07-18 ENCOUNTER — Ambulatory Visit (RURAL_HEALTH_CENTER): Payer: Self-pay | Admitting: Family

## 2022-07-18 MED ORDER — FERROUS SULFATE 324 MG (65 MG IRON) TABLET,DELAYED RELEASE
324.0000 mg | DELAYED_RELEASE_TABLET | Freq: Two times a day (BID) | ORAL | 0 refills | Status: DC
Start: 2022-07-18 — End: 2022-10-12

## 2022-07-18 MED ORDER — FERROUS SULFATE 324 MG (65 MG IRON) TABLET,DELAYED RELEASE
324.0000 mg | DELAYED_RELEASE_TABLET | Freq: Two times a day (BID) | ORAL | 0 refills | Status: DC
Start: 2022-07-18 — End: 2022-07-18

## 2022-07-20 NOTE — Progress Notes (Signed)
Error: Patient not seen by provider, order only.

## 2022-08-01 ENCOUNTER — Other Ambulatory Visit (RURAL_HEALTH_CENTER): Payer: Self-pay | Admitting: Family

## 2022-08-16 ENCOUNTER — Ambulatory Visit (RURAL_HEALTH_CENTER): Payer: Medicare Other | Attending: Family | Admitting: Family

## 2022-08-16 ENCOUNTER — Other Ambulatory Visit: Payer: Self-pay

## 2022-08-16 ENCOUNTER — Ambulatory Visit: Payer: Medicare Other | Attending: Family | Admitting: Family

## 2022-08-16 ENCOUNTER — Other Ambulatory Visit (RURAL_HEALTH_CENTER): Payer: Self-pay | Admitting: Family

## 2022-08-16 ENCOUNTER — Encounter (RURAL_HEALTH_CENTER): Payer: Self-pay | Admitting: Family

## 2022-08-16 VITALS — BP 152/57 | HR 101 | Temp 97.9°F | Ht 66.0 in | Wt 254.0 lb

## 2022-08-16 DIAGNOSIS — D508 Other iron deficiency anemias: Secondary | ICD-10-CM

## 2022-08-16 DIAGNOSIS — R82998 Other abnormal findings in urine: Secondary | ICD-10-CM | POA: Insufficient documentation

## 2022-08-16 DIAGNOSIS — M545 Low back pain, unspecified: Secondary | ICD-10-CM | POA: Insufficient documentation

## 2022-08-16 DIAGNOSIS — Z7409 Other reduced mobility: Secondary | ICD-10-CM | POA: Insufficient documentation

## 2022-08-16 HISTORY — DX: Other abnormal findings in urine: R82.998

## 2022-08-16 IMAGING — DX XRAY LUMBAR SPINE MINIMUM 4 VIEWS
1 series · 5 of 5 positions shown · non-contrast
Comparison: None available.

﻿EXAM:  [id number]   XRAY LUMBAR SPINE MINIMUM 4 VIEWS
INDICATION: Chronic low back pain radiating to both legs.

[Series 1: obl · 0.14mm/px · 5 of 5 slices shown]
[im 1/5]
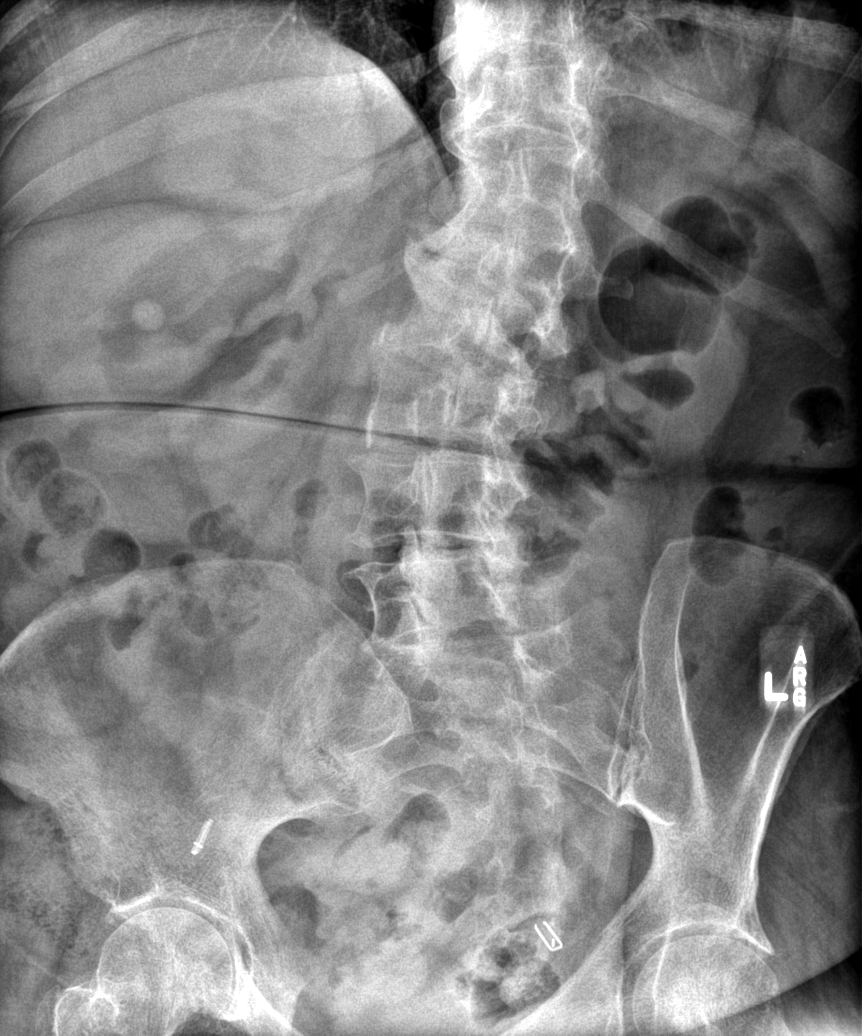
[im 2/5]
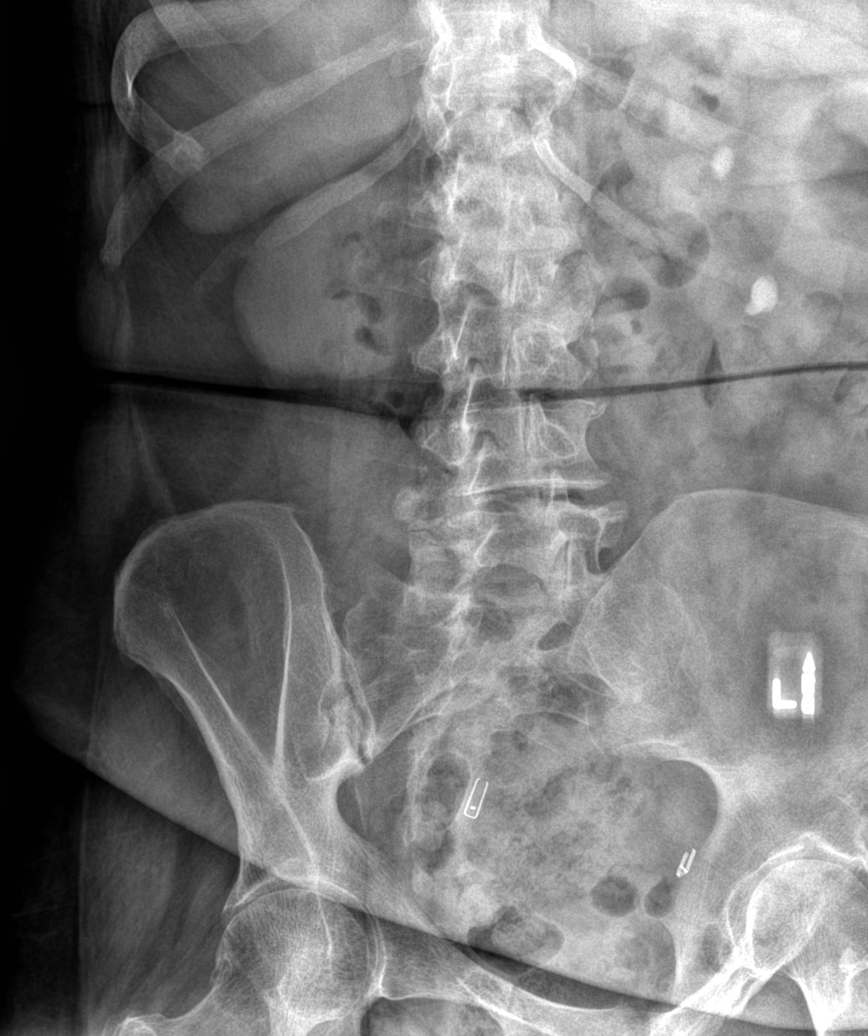
[im 3/5]
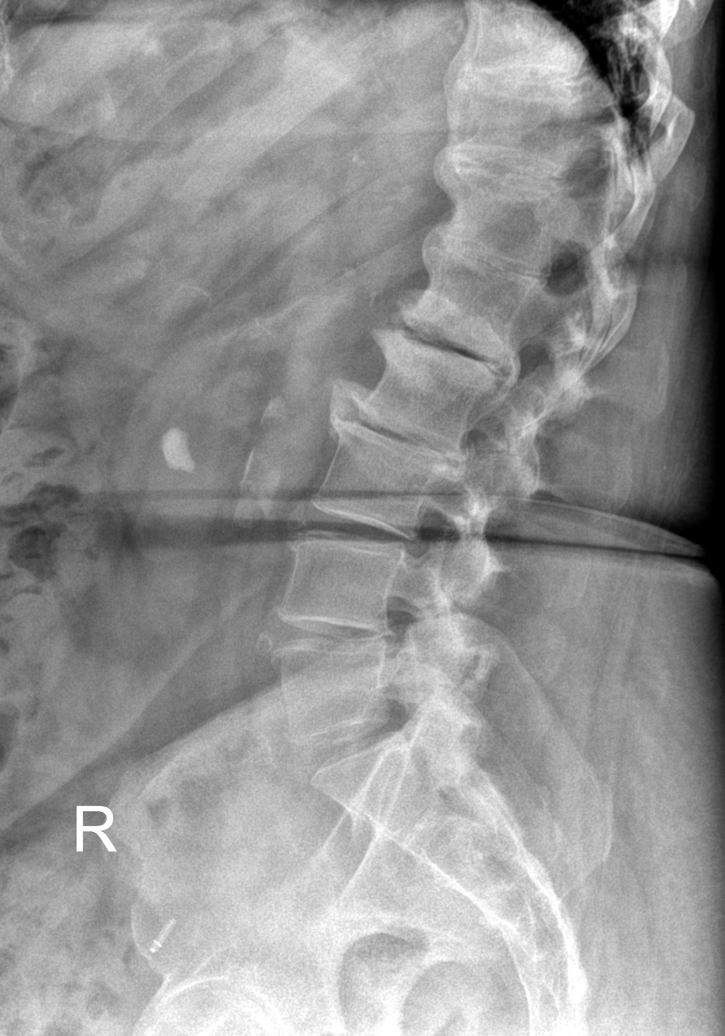
[im 4/5]
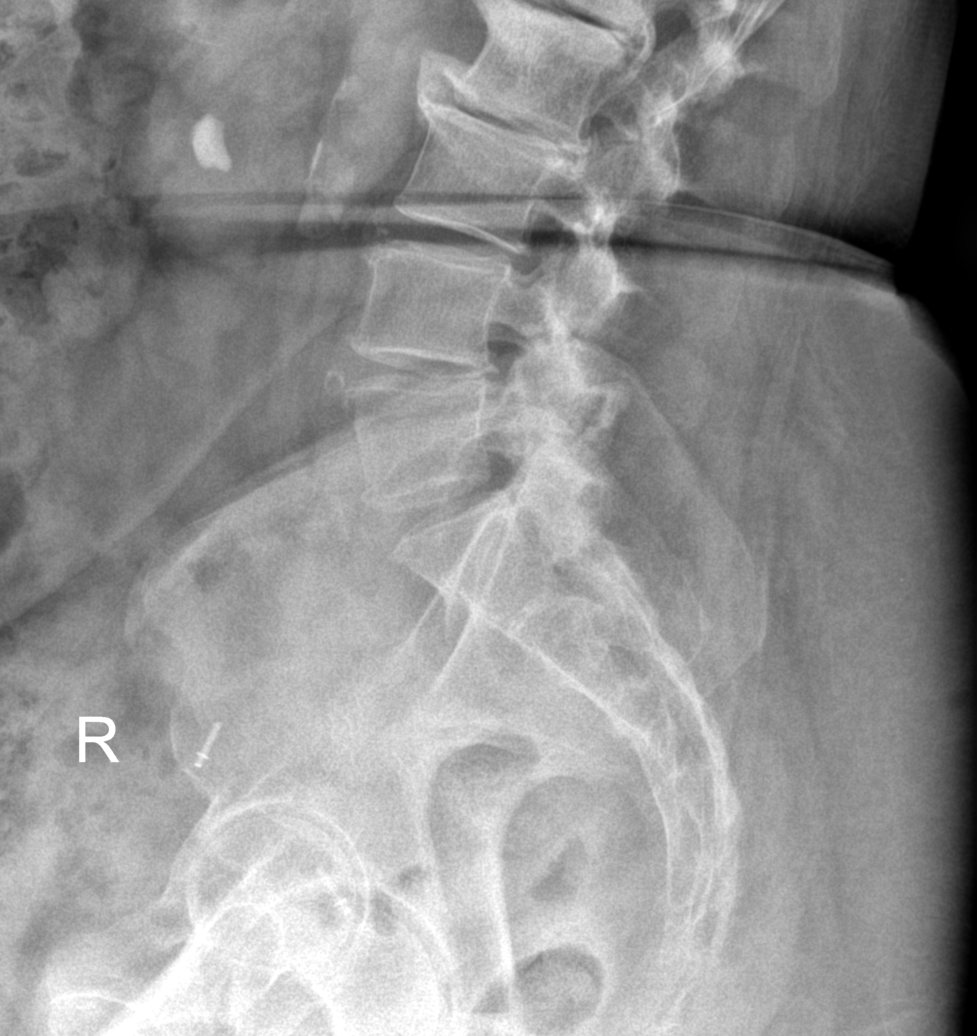
[im 5/5]
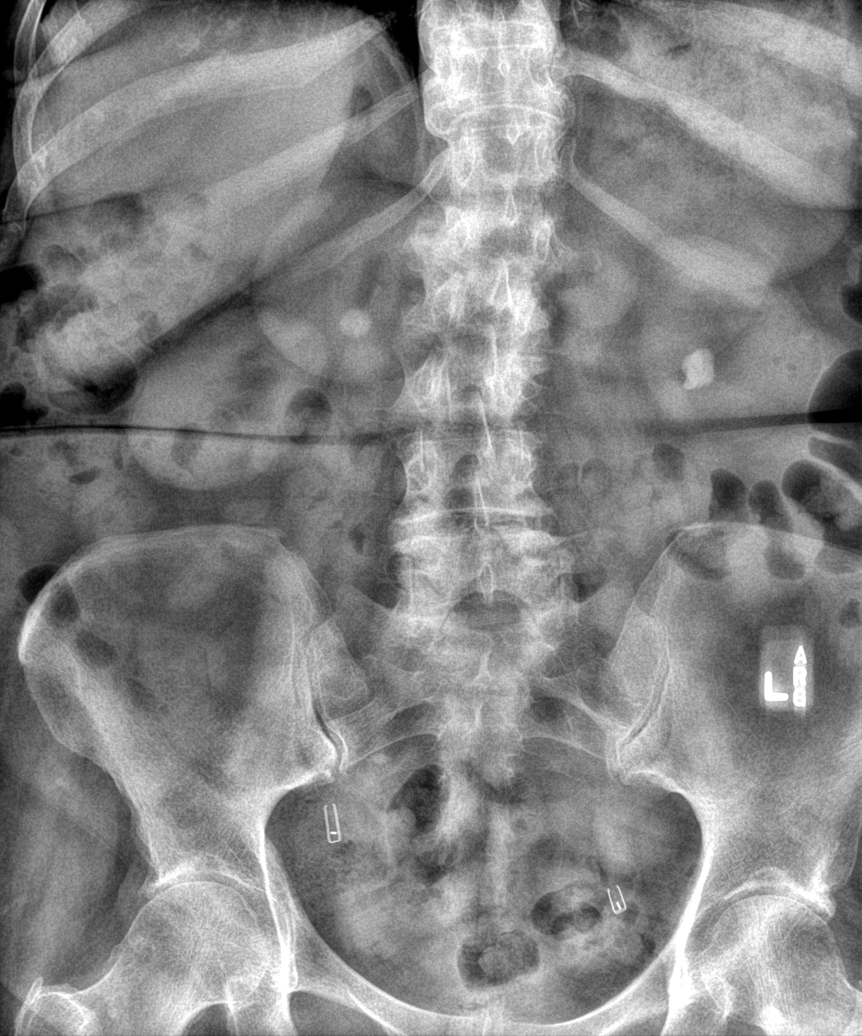

[5 of 5 positions shown; findings below may reference images not displayed]

FINDINGS: Five lumbar vertebral segments are noted.  Severe multilevel degenerative disc changes and facet arthropathy are noted moist prominently in the mid lumbar spine and upper lumbar spine.  Significant facet arthropathy at L4-5 and L5-S1 levels is suggested.  

Calcific densities in the projection of the kidneys suggestive of renal calculi.  Possible gallstone in the upper right abdomen.  Atherosclerotic changes of abdominal aorta.  Bilateral sacroiliac arthritis.
IMPRESSION: 1. Severe multilevel degenerative disc disease and facet arthropathy of lumbosacral discs as described above. If symptoms are persistent, MRI is indicated. 

2. Bilateral sacroiliac arthritis.  Significant degenerative changes of right hip joint. 

3. Calcific densities in the projection of the kidneys and gallbladder suggestive of calculi.  

Electronically Signed by FRIVOLE, PADIACHY at [id number] [DATE]

## 2022-08-16 MED ORDER — TRIAMCINOLONE ACETONIDE 40 MG/ML SUSPENSION FOR INJECTION
40.0000 mg | INTRAMUSCULAR | Status: AC
Start: 2022-08-16 — End: 2022-08-16
  Administered 2022-08-16: 40 mg via INTRAMUSCULAR

## 2022-08-16 NOTE — Progress Notes (Signed)
[facility] MEDICINE [facility]  [facility] FAMILY MEDICINE      TYRONZA HAPPE  MRN: [id number]  DOB: 10-30-1955  Date of Service: 08/16/2022    CHIEF COMPLAINT  Chief Complaint   Patient presents with    Low Back Pain     Having lower back pain,  has been going on for years,  getting worse,   has had MRI in the past,         SUBJECTIVE  WILLEAN SCHURMAN is a 67 y.o. female who presents to clinic for low back pain x1 month.  Patient reports have progressively gotten worse.  History of chronic back pain over the years.  Denies history of back surgery.  Patient reports she is under the care of a chiropractor every 4 week, [facility].  Patient reports symptoms have not improved.  Patient complains of radiating pain down both hips and legs.  Patient reports pain limit her physical activity she continued to gain weight.  Patient denies loss of control of bowel or bladder.  Denies numbness or tingling.  Denies recent falls or injuries.  Patient reports she does have a fear of falling.  Patient is ambulatory without assistance.  Denies recent hospitalizations or surgeries.  Denies antibiotic use in the past 30 days.  Denies thyroid joints in the past 30 days.     Review of Systems:  Positive ROS discussed in HPI, otherwise all other systems negative.      Medications:   aspirin (ECOTRIN) 81 mg Oral Tablet, Delayed Release (E.C.), Take 1 Tablet (81 mg total) by mouth Once a day  cholecalciferol, vitamin D3, 25 mcg (1,000 unit) Oral Tablet, Take 1 Tablet (1,000 Units total) by mouth Once a day  collagen/biotin/ascorbic acid (COLLAGEN 1500 PLUS C ORAL), Take 1,000 mg by mouth  cyanocobalamin (VITAMIN B 12) 1,000 mcg Oral Tablet, Take 1 Tablet (1,000 mcg total) by mouth Once a day  ferrous sulfate (FERATAB) 324 mg (65 mg iron) Oral Tablet, Delayed Release (E.C.), Take 1 Tablet (324 mg total) by mouth Twice a day before meals for 90 days  losartan (COZAAR) 25 mg Oral Tablet, Take 1 Tablet (25 mg total) by mouth Once  a day for blood pressure  magnesium chloride (SLOW-MAG) 64 mg Oral Tablet, Delayed Release (E.C.), Take 1 Tablet (64 mg total) by mouth Once a day  metFORMIN (GLUCOPHAGE) 500 mg Oral Tablet, Take 2 Tablets (1,000 mg total) by mouth Twice daily  multivit-min-mfolate-K-herb289 (ALIVE WOMEN'S 50 PLUS ULTRA) 800 mcg DFE- 150 mcg Oral Tablet, Take 1 Tablet by mouth Once a day  omega-3 fatty acid (LOVAZA) 1 gram Oral Capsule, Take 2 Capsules (2 g total) by mouth Twice daily  omeprazole (PRILOSEC) 20 mg Oral Capsule, Delayed Release(E.C.), Take 1 Capsule (20 mg total) by mouth Once a day  pioglitazone (ACTOS) 30 mg Oral Tablet, Take 1 Tablet (30 mg total) by mouth Once a day for 90 days  potassium gluconate 600 mg (99 mg) Oral Tablet, Take 1 Tablet by mouth Once a day  rosuvastatin (CRESTOR) 20 mg Oral Tablet, Take 1 Tablet (20 mg total) by mouth Once a day    No facility-administered medications prior to visit.      Allergies:   No Known Allergies      OBJECTIVE  BP (!) 152/57   Pulse (!) 101   Temp 36.6 C (97.9 F)   Ht 1.676 m ( )   Wt 115 kg (254 lb)   SpO2 95%  BMI 41.00 kg/m       Physical Exam  Constitutional:       Appearance: She is obese.   Cardiovascular:      Rate and Rhythm: Normal rate and regular rhythm.      Pulses: Normal pulses.      Heart sounds: Normal heart sounds.   Musculoskeletal:      Thoracic back: Normal. No swelling, edema, deformity, signs of trauma, lacerations, spasms, tenderness or bony tenderness. Normal range of motion. No scoliosis.      Lumbar back: Normal. No swelling, edema, deformity, signs of trauma, lacerations, spasms, tenderness or bony tenderness. Normal range of motion. Negative right straight leg raise test and negative left straight leg raise test. No scoliosis.   Neurological:      Mental Status: She is alert.      Gait: Gait is intact.   Psychiatric:         Behavior: Behavior normal.         Cognition and Memory: Cognition normal.            ASSESSMENT/PLAN  (M54.50) Low back pain  (primary encounter diagnosis)  Plan: XR LUMBAR SPINE SERIES, Refer to Physical         Therapy-External, CBC/DIFF, COMPREHENSIVE         METABOLIC PANEL, NON-FASTING, SEDIMENTATION         RATE, C-REACTIVE PROTEIN(CRP),INFLAMMATION,         URINE CULTURE    (R82.998) Leukocytes in urine  Plan: URINE CULTURE    (Z74.09) Impaired functional mobility, balance, gait, and endurance       Problem List Items Addressed This Visit          Nephrology    Leukocytes in urine    Relevant Orders    URINE CULTURE       Musculoskeletal    Low back pain - Primary    Relevant Orders    XR LUMBAR SPINE SERIES    Refer to Physical Therapy-External    CBC/DIFF    COMPREHENSIVE METABOLIC PANEL, NON-FASTING    SEDIMENTATION RATE    C-REACTIVE PROTEIN(CRP),INFLAMMATION    URINE CULTURE       Other    Impaired functional mobility, balance, gait, and endurance      Orders Placed This Encounter    URINE CULTURE    XR LUMBAR SPINE SERIES    CBC/DIFF    COMPREHENSIVE METABOLIC PANEL, NON-FASTING    SEDIMENTATION RATE    C-REACTIVE PROTEIN(CRP),INFLAMMATION    Refer to Physical Therapy-External    triamcinolone acetonide (KENALOG-40) 40 mg/mL injection      Kenalog 40 mg IM given today.  Lumbar x-ray ordered, x-ray ordered given to patient.  Physical therapy order given to patient, patient will schedule her own physical therapy appointment.  Advised over-the-counter Tylenol/Motrin as needed.  Advised activity as tolerated.  Advised to follow-up if symptoms progress or do not resolve.    Return if symptoms worsen or fail to improve.    Stormy Fabian, NP-C 08/16/2022, 14:49

## 2022-08-19 LAB — URINE CULTURE: URINE CULTURE: 50000 — AB

## 2022-08-23 ENCOUNTER — Ambulatory Visit (RURAL_HEALTH_CENTER): Payer: Self-pay | Admitting: Family

## 2022-08-23 DIAGNOSIS — E782 Mixed hyperlipidemia: Secondary | ICD-10-CM

## 2022-08-23 DIAGNOSIS — I10 Essential (primary) hypertension: Secondary | ICD-10-CM

## 2022-08-23 DIAGNOSIS — E538 Deficiency of other specified B group vitamins: Secondary | ICD-10-CM

## 2022-08-23 DIAGNOSIS — E559 Vitamin D deficiency, unspecified: Secondary | ICD-10-CM

## 2022-08-23 DIAGNOSIS — D508 Other iron deficiency anemias: Secondary | ICD-10-CM

## 2022-08-23 DIAGNOSIS — E119 Type 2 diabetes mellitus without complications: Secondary | ICD-10-CM

## 2022-08-23 DIAGNOSIS — K219 Gastro-esophageal reflux disease without esophagitis: Secondary | ICD-10-CM

## 2022-09-11 ENCOUNTER — Ambulatory Visit (RURAL_HEALTH_CENTER): Payer: Self-pay | Admitting: Family

## 2022-09-12 ENCOUNTER — Other Ambulatory Visit (RURAL_HEALTH_CENTER): Payer: Self-pay | Admitting: Family

## 2022-09-12 DIAGNOSIS — E538 Deficiency of other specified B group vitamins: Secondary | ICD-10-CM

## 2022-09-12 DIAGNOSIS — E782 Mixed hyperlipidemia: Secondary | ICD-10-CM

## 2022-09-12 DIAGNOSIS — D508 Other iron deficiency anemias: Secondary | ICD-10-CM

## 2022-09-12 DIAGNOSIS — E119 Type 2 diabetes mellitus without complications: Secondary | ICD-10-CM

## 2022-09-12 DIAGNOSIS — K219 Gastro-esophageal reflux disease without esophagitis: Secondary | ICD-10-CM

## 2022-09-12 DIAGNOSIS — E559 Vitamin D deficiency, unspecified: Secondary | ICD-10-CM

## 2022-09-12 DIAGNOSIS — I10 Essential (primary) hypertension: Secondary | ICD-10-CM

## 2022-09-18 ENCOUNTER — Ambulatory Visit (RURAL_HEALTH_CENTER): Payer: Self-pay | Admitting: Family

## 2022-09-26 ENCOUNTER — Ambulatory Visit (RURAL_HEALTH_CENTER): Payer: Medicare Other | Attending: Family | Admitting: Family

## 2022-09-26 ENCOUNTER — Other Ambulatory Visit: Payer: Self-pay

## 2022-09-26 ENCOUNTER — Encounter (RURAL_HEALTH_CENTER): Payer: Self-pay | Admitting: Family

## 2022-09-26 VITALS — BP 149/75 | HR 80 | Temp 98.3°F | Resp 18 | Ht 65.0 in | Wt 255.0 lb

## 2022-09-26 DIAGNOSIS — Z6841 Body Mass Index (BMI) 40.0 and over, adult: Secondary | ICD-10-CM | POA: Insufficient documentation

## 2022-09-26 DIAGNOSIS — K219 Gastro-esophageal reflux disease without esophagitis: Secondary | ICD-10-CM | POA: Insufficient documentation

## 2022-09-26 DIAGNOSIS — D508 Other iron deficiency anemias: Secondary | ICD-10-CM | POA: Insufficient documentation

## 2022-09-26 DIAGNOSIS — Z7984 Long term (current) use of oral hypoglycemic drugs: Secondary | ICD-10-CM | POA: Insufficient documentation

## 2022-09-26 DIAGNOSIS — E782 Mixed hyperlipidemia: Secondary | ICD-10-CM

## 2022-09-26 DIAGNOSIS — I10 Essential (primary) hypertension: Secondary | ICD-10-CM | POA: Insufficient documentation

## 2022-09-26 DIAGNOSIS — E119 Type 2 diabetes mellitus without complications: Secondary | ICD-10-CM | POA: Insufficient documentation

## 2022-09-26 DIAGNOSIS — Z7409 Other reduced mobility: Secondary | ICD-10-CM | POA: Insufficient documentation

## 2022-09-26 NOTE — Assessment & Plan Note (Signed)
Discussed diet and exercise 

## 2022-09-26 NOTE — Assessment & Plan Note (Signed)
Continue Lovaza and Crestor 20 mg daily.  Advised to limit high fat foods and processed foods in diet..

## 2022-09-26 NOTE — Assessment & Plan Note (Signed)
Symptoms well controlled on omeprazole 20 mg daily.

## 2022-09-26 NOTE — Assessment & Plan Note (Addendum)
BP elevated today, patient will monitor BP daily and RTC in 2 weeks to recheck BP , if BP is > 130/80.  Patient reports BP is normal at cardiology appt. Continue current medications.  Avoid OTC NSAID'S.  Limit sodium in diet.

## 2022-09-26 NOTE — Assessment & Plan Note (Signed)
A1c 6.3 .  Continue current medications.  Advised a diabetic diet and daily physical exercise.

## 2022-09-26 NOTE — Assessment & Plan Note (Signed)
Discussed fall risk.  Denies history of falls.  Patient declined PT/OT.

## 2022-09-26 NOTE — Assessment & Plan Note (Addendum)
Continue ferrous sulfate 325mg BID.

## 2022-09-26 NOTE — Progress Notes (Signed)
FAMILY MEDICINE, [facility] FAMILY MEDICINE [facility]  [address]  [place] [address]  Operated by [facility]     Name: Amber Owens MRN:  [id number]   Date of Birth: 10/14/1955 Age: 67 y.o.   Date: 09/26/2022  Time: 17:13     Provider: Stormy Fabian, NP-C    Reason for visit: Follow Up 3 Months      History of Present Illness:  Amber Owens is a 67 y.o. female presenting with chronic disease management.      Patient Active Problem List    Diagnosis Date Noted    Low back pain 08/16/2022    Iron deficiency anemia 06/19/2022    Hypomagnesemia 06/19/2022     Rx:  Magnesium 64 mg daily.      Osteoarthritis (arthritis due to wear and tear of joints) 04/03/2022    Impaired functional mobility, balance, gait, and endurance 03/16/2022     Ambulatory without assistance.      Morbid obesity (CMS HCC) 12/12/2021    B12 deficiency 11/08/2021    Essential hypertension 08/01/2021     Dr. Renda Rolls.      Mixed hyperlipidemia 08/01/2021    Gastroesophageal reflux disease without esophagitis 08/01/2021    Environmental and seasonal allergies 08/01/2021     Advised and over-the-counter antihistamine.  Patient declined respiratory screenings.       Vitamin D deficiency 08/01/2021     Vitamin-4000 IU daily and OTC calcium 1200 mg daily.      Type 2 diabetes mellitus without complication, without long-term current use of insulin (CMS HCC) 06/27/2018     Dx 2013.  Last eye exam 12/18/20, Dr. Micheline Rough.         Historical Data    Past Medical History:  Past Medical History:   Diagnosis Date    Abdominal pain     Anxiety     Anxiety     B12 deficiency     Chronic back pain     Chronic constipation     Chronic left hip pain     COVID     Cystocele with rectocele     Diabetes mellitus, type 2 (CMS HCC)     Diabetic neuropathy, type II diabetes mellitus (CMS HCC)     Encounter for screening for malignant neoplasm of breast     Esophageal reflux     Female bladder prolapse     Hypercholesterolemia      Hypertension     Intermittent claudication (CMS HCC)     Leukocytes in urine 08/16/2022    Morbid obesity with BMI of 40.0-44.9, adult (CMS HCC)     OAB (overactive bladder)     Osteoarthritis of spinal facet joint     Steatosis of liver     Vaginal vault prolapse     Vitamin D deficiency      Past Surgical History:  Past Surgical History:   Procedure Laterality Date    BLADDER SURGERY      prolapse bladder    COLONOSCOPY      HX APPENDECTOMY      HX HYSTERECTOMY      HX TUBAL LIGATION      REPAIR RECTOCELE       Allergies:  No Known Allergies  Medications:  Current Outpatient Medications   Medication Sig    aspirin (ECOTRIN) 81 mg Oral Tablet, Delayed Release (E.C.) Take 1 Tablet (81 mg total) by mouth Once a day  cholecalciferol, vitamin D3, 25 mcg (1,000 unit) Oral Tablet Take 1 Tablet (1,000 Units total) by mouth Once a day    collagen/biotin/ascorbic acid (COLLAGEN 1500 PLUS C ORAL) Take 1,000 mg by mouth    cyanocobalamin (VITAMIN B 12) 1,000 mcg Oral Tablet Take 1 Tablet (1,000 mcg total) by mouth Once a day    ferrous sulfate (FERATAB) 324 mg (65 mg iron) Oral Tablet, Delayed Release (E.C.) Take 1 Tablet (324 mg total) by mouth Twice a day before meals for 90 days    losartan (COZAAR) 25 mg Oral Tablet Take 1 Tablet (25 mg total) by mouth Once a day for blood pressure    magnesium chloride (SLOW-MAG) 64 mg Oral Tablet, Delayed Release (E.C.) Take 1 Tablet (64 mg total) by mouth Once a day    metFORMIN (GLUCOPHAGE) 500 mg Oral Tablet Take 2 Tablets (1,000 mg total) by mouth Twice daily    multivit-min-mfolate-K-herb289 (ALIVE WOMEN'S 50 PLUS ULTRA) 800 mcg DFE- 150 mcg Oral Tablet Take 1 Tablet by mouth Once a day    omega-3 fatty acid (LOVAZA) 1 gram Oral Capsule Take 2 Capsules (2 g total) by mouth Twice daily    omeprazole (PRILOSEC) 20 mg Oral Capsule, Delayed Release(E.C.) Take 1 Capsule (20 mg total) by mouth Once a day    pioglitazone (ACTOS) 30 mg Oral Tablet Take 1 Tablet (30 mg total) by mouth  Once a day for 90 days    potassium gluconate 600 mg (99 mg) Oral Tablet Take 1 Tablet by mouth Once a day    rosuvastatin (CRESTOR) 20 mg Oral Tablet Take 1 Tablet (20 mg total) by mouth Once a day     Family History:  Family Medical History:       Problem Relation (Age of Onset)    Breast Cancer Maternal Aunt    No Known Problems Mother, Father, Sister, Brother, Maternal Grandmother, Maternal Grandfather, Paternal Grandmother, Paternal Grandfather, Daughter, Son, Maternal Uncle, Paternal Aunt, Paternal Education officer, community, Other            Social History:  Social History     Socioeconomic History    Marital status: Married     Spouse name: Elina Blase    Number of children: 1   Tobacco Use    Smoking status: Never    Smokeless tobacco: Never   Substance and Sexual Activity    Alcohol use: Never    Drug use: Never     Social Determinants of Health     Financial Resource Strain: Low Risk  (09/26/2022)    Financial Resource Strain     SDOH Financial: No   Transportation Needs: Low Risk  (09/26/2022)    Transportation Needs     SDOH Transportation: No   Social Connections: Low Risk  (09/26/2022)    Social Connections     SDOH Social Isolation: 5 or more times a week   Intimate Partner Violence: Low Risk  (09/26/2022)    Intimate Partner Violence     SDOH Domestic Violence: No   Recent Concern: Intimate Partner Violence - High Risk (08/16/2022)    Intimate Partner Violence     SDOH Domestic Violence: No   Housing Stability: Low Risk  (09/26/2022)    Housing Stability     SDOH Housing Situation: I have housing.     SDOH Housing Worry: No           Review of Systems:  Any pertinent Review of Systems as addressed in the HPI above.  Physical Exam:  Vital Signs:  Vitals:    09/26/22 1438   BP: (!) 149/75   Pulse: 80   Resp: 18   Temp: 36.8 C (98.3 F)   SpO2: 100%   Weight: 116 kg (255 lb)   Height: 1.651 m (5\' 5" )   BMI: 42.52     Physical Exam  Vitals reviewed.   Constitutional:       Appearance: Normal appearance. She is obese.   HENT:       Nose:      Right Turbinates: Pale.      Left Turbinates: Pale.   Cardiovascular:      Rate and Rhythm: Normal rate and regular rhythm.      Pulses: Normal pulses.      Heart sounds: Normal heart sounds, S1 normal and S2 normal.   Pulmonary:      Effort: Pulmonary effort is normal.      Breath sounds: Normal breath sounds.   Abdominal:      General: Bowel sounds are normal.      Palpations: Abdomen is soft.   Musculoskeletal:      Cervical back: Neck supple.      Right lower leg: No edema.      Left lower leg: No edema.   Lymphadenopathy:      Cervical: No cervical adenopathy.   Skin:     General: Skin is warm.   Neurological:      General: No focal deficit present.      Mental Status: She is alert and oriented to person, place, and time.      Cranial Nerves: Cranial nerves 2-12 are intact.      Motor: Motor function is intact.      Coordination: Coordination is intact.      Gait: Gait normal.   Psychiatric:         Mood and Affect: Mood normal.         Behavior: Behavior normal. Behavior is cooperative.         Thought Content: Thought content normal.         Cognition and Memory: Cognition normal.         Judgment: Judgment normal.          Assessment/Plan:  (E11.9) Type 2 diabetes mellitus without complication, without long-term current use of insulin (CMS HCC)  (primary encounter diagnosis)    (I10) Essential hypertension    (E78.2) Mixed hyperlipidemia    (K21.9) Gastroesophageal reflux disease without esophagitis    (D50.8) Iron deficiency anemia secondary to inadequate dietary iron intake    (Z74.09) Impaired functional mobility, balance, gait, and endurance    (E66.01) Morbid obesity (CMS HCC)           Problem List Items Addressed This Visit          Cardiovascular System    Essential hypertension     BP well controlled.  Continue current medications.         Mixed hyperlipidemia     Continue Lovaza and Crestor 20 mg daily.  Advised to limit high fat foods and processed foods in diet..             Digestive    Gastroesophageal reflux disease without esophagitis     Symptoms well controlled on omeprazole 20 mg daily.            Endocrine    Type 2 diabetes mellitus without complication, without long-term current use of insulin (CMS HCC) -  Primary     A1c 6.3 .  Continue current medications.  Advised a diabetic diet and daily physical exercise.            Hematologic/Lymphatic    Iron deficiency anemia     Continue ferrous sulfate 325mg  BID.              Other    Morbid obesity (CMS HCC)     Discussed diet and exercise.         Impaired functional mobility, balance, gait, and endurance     Discussed fall risk.  Denies history of falls.  Patient declined PT/OT.            Discussed labs s today.  Continue current medications as prescribed.  Advised a low-fat/low sodium diet, advised 150 minutes of scheduled weekly physical activity as tolerated.  Advised to maintain a healthy weight.   Return in about 3 months (around 12/27/2022) for routine visit; schedule appt for right groin pain.    Stormy Fabian, NP-C     Portions of this note may be dictated using voice recognition software or a dictation service. Variances in spelling and vocabulary are possible and unintentional. Not all errors are caught/corrected. Please notify the Thereasa Parkin if any discrepancies are noted or if the meaning of any statement is not clear.

## 2022-10-03 ENCOUNTER — Other Ambulatory Visit: Payer: Self-pay

## 2022-10-03 ENCOUNTER — Encounter (RURAL_HEALTH_CENTER): Payer: Self-pay | Admitting: Family

## 2022-10-03 ENCOUNTER — Ambulatory Visit (RURAL_HEALTH_CENTER): Payer: Medicare Other | Attending: Family | Admitting: Family

## 2022-10-03 ENCOUNTER — Ambulatory Visit: Payer: Medicare Other | Attending: Family | Admitting: Family

## 2022-10-03 VITALS — BP 136/74 | HR 99 | Temp 97.1°F | Resp 18 | Ht 65.0 in | Wt 252.0 lb

## 2022-10-03 DIAGNOSIS — N3001 Acute cystitis with hematuria: Secondary | ICD-10-CM

## 2022-10-03 DIAGNOSIS — G8929 Other chronic pain: Secondary | ICD-10-CM

## 2022-10-03 DIAGNOSIS — R1031 Right lower quadrant pain: Secondary | ICD-10-CM

## 2022-10-03 HISTORY — DX: Right lower quadrant pain: R10.31

## 2022-10-03 HISTORY — DX: Other chronic pain: G89.29

## 2022-10-03 HISTORY — DX: Acute cystitis with hematuria: N30.01

## 2022-10-03 LAB — POCT URINE DIPSTICK
GLUCOSE: NEGATIVE
KETONE: 15
NITRITE: POSITIVE
PH: 5.5
PROTEIN: 300
SPECIFIC GRAVITY: 1.03
UROBILINOGEN: 1

## 2022-10-03 IMAGING — DX XRAY ABDOMEN 2 VIEW
1 series · 3 of 3 positions shown · non-contrast
Comparison: None available.

﻿EXAM:  [id number]   XRAY ABDOMEN 2 VIEW
INDICATION: Hematuria, right lower groin pain.
TECHNIQUE: Supine and upright films are submitted.

[Series 1: aperect · 0.14mm/px · 3 of 3 slices shown]
[im 1/3]
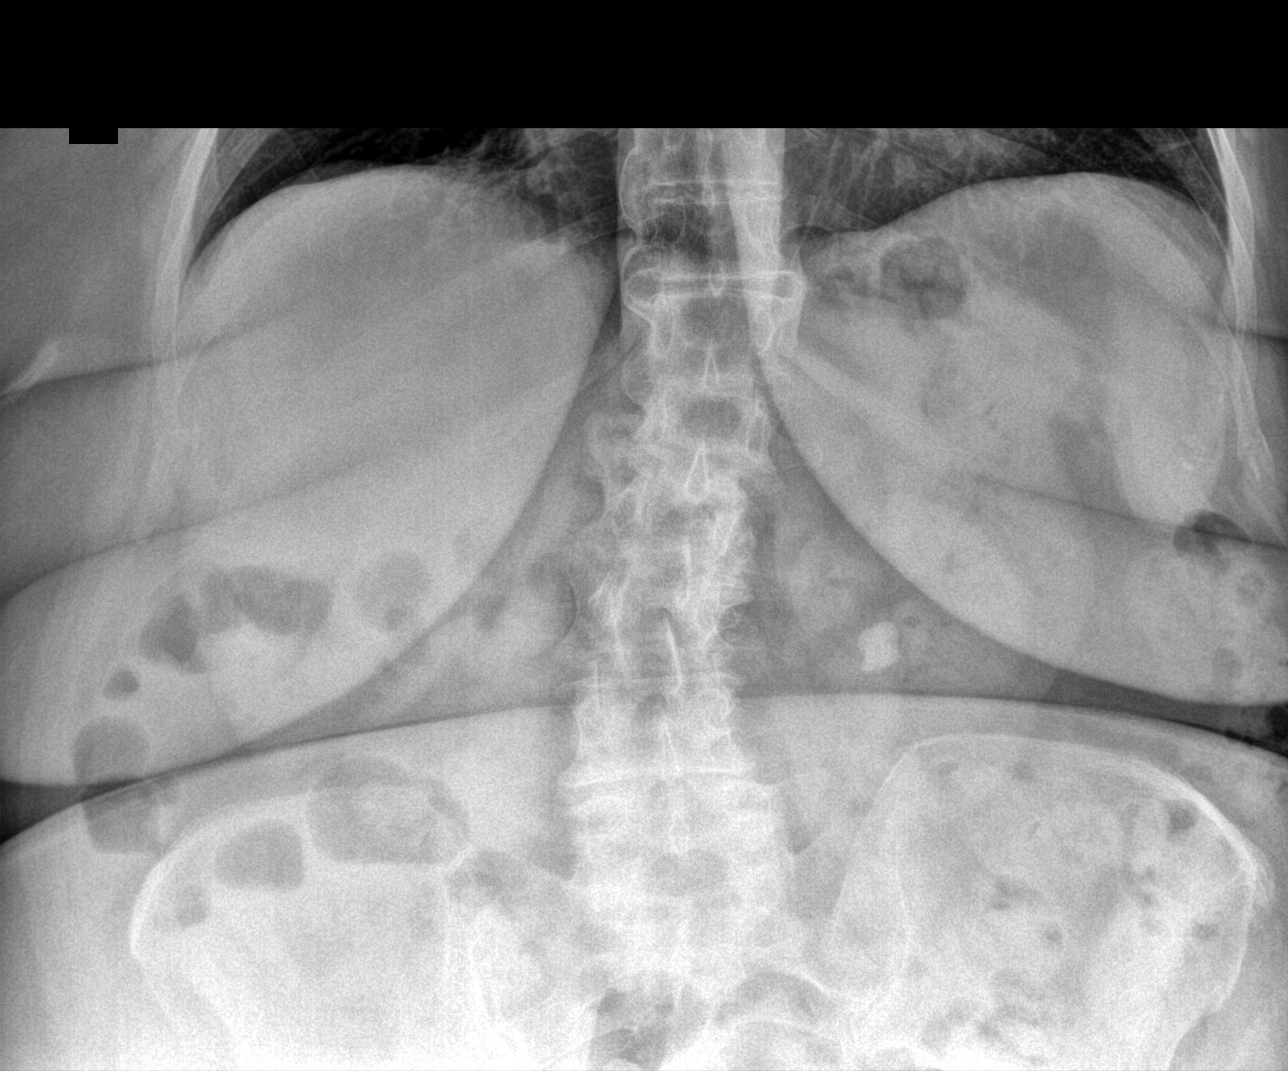
[im 2/3]
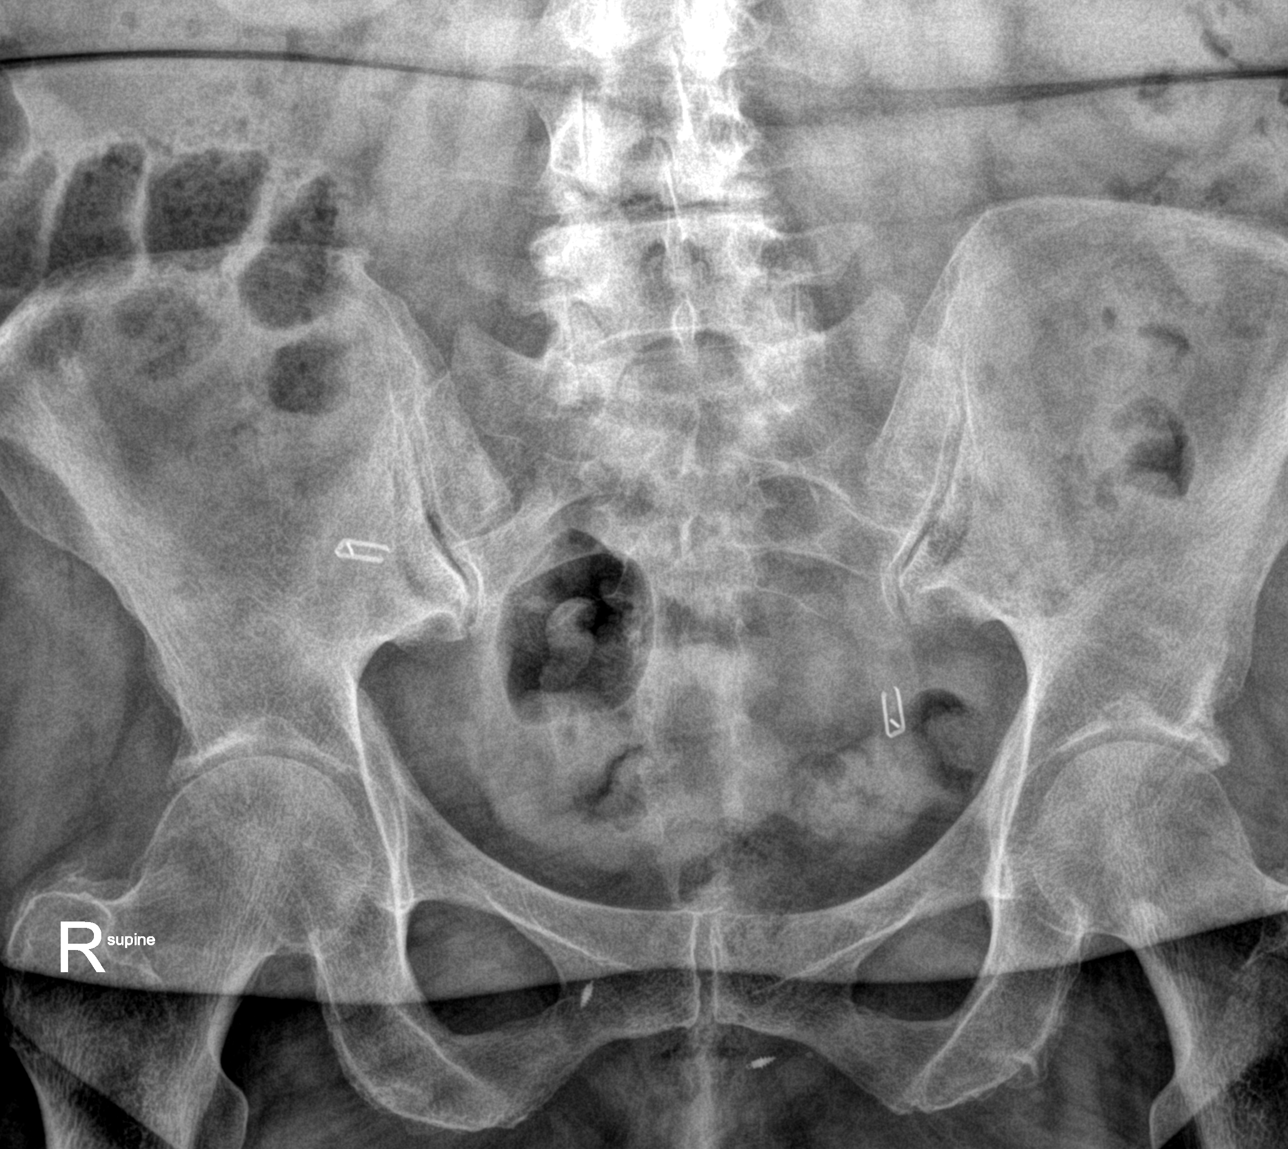
[im 3/3]
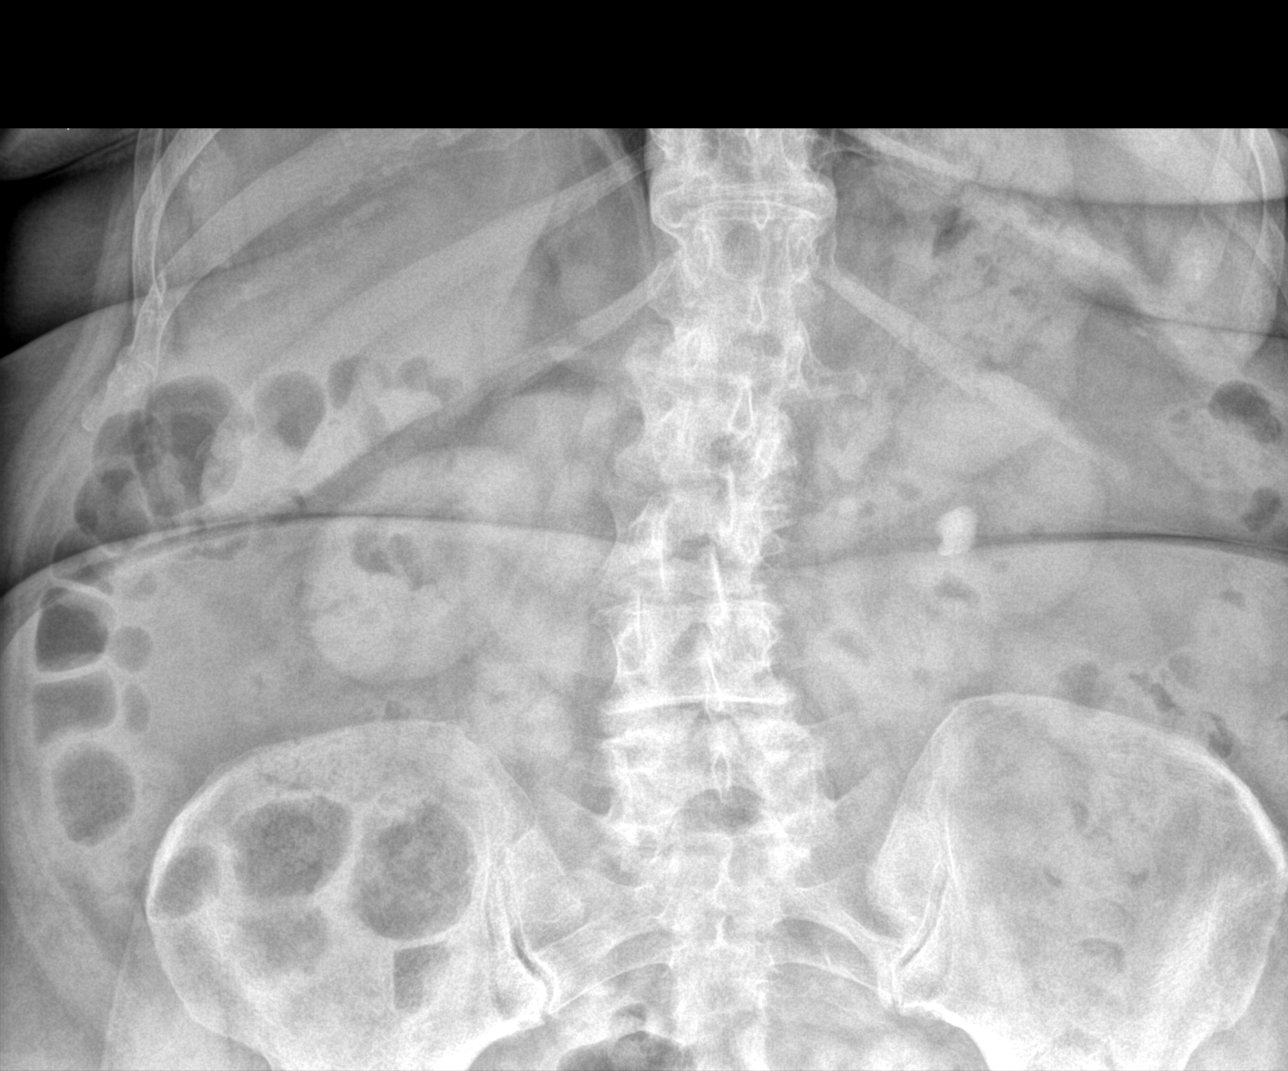

[3 of 3 positions shown; findings below may reference images not displayed]

FINDINGS: There is a 1.7 cm somewhat irregular calcification overlying the lower pole of the left kidney compatible with a nonobstructive renal calculus.  No ureteral or right renal calculi are seen.  

The bowel gas pattern appears unremarkable.  There is no free intraperitoneal gas.  

Tubal ligation clips are noted in the pelvis.  Small, metallic surgical clips are also seen overlying the region of the symphysis pubis.

There are moderately severe degenerative changes in the lumbar spine.
IMPRESSION: A 1.7 cm left lower pole renal calculus.

## 2022-10-03 MED ORDER — SULFAMETHOXAZOLE 800 MG-TRIMETHOPRIM 160 MG TABLET
1.0000 | ORAL_TABLET | Freq: Two times a day (BID) | ORAL | 0 refills | Status: DC
Start: 2022-10-03 — End: 2022-10-19

## 2022-10-03 NOTE — Assessment & Plan Note (Signed)
Further treatment pending KUB xray.Marland Kitchen

## 2022-10-03 NOTE — Progress Notes (Signed)
[facility] MEDICINE [facility]  [facility] FAMILY MEDICINE      Amber Owens  MRN: [id number]  DOB: 1955-05-16  Date of Service: 10/03/2022    CHIEF COMPLAINT  Chief Complaint   Patient presents with    Groin Pain       SUBJECTIVE  Amber Owens is a 67 y.o. female who presents to clinic for right groin pain x 2 years, worse in the past 6 months.  Denies vaginal discharge.  Denies blood in urine.  Patient reports she does not drink water, mostly drinks decaffeinated tea and caffeinated coffee.   Denies fever, nausea, vomiting or diarrhea.  Denies chest pain or shortness of breath.  Denies dyspareunia.   Denies vaginal discharge.  Reports hx of UTI.  Denies suprapubic or flank pain.     Review of Systems:  Positive ROS discussed in HPI, otherwise all other systems negative.      Medications:   aspirin (ECOTRIN) 81 mg Oral Tablet, Delayed Release (E.C.), Take 1 Tablet (81 mg total) by mouth Once a day  cholecalciferol, vitamin D3, 25 mcg (1,000 unit) Oral Tablet, Take 1 Tablet (1,000 Units total) by mouth Once a day  collagen/biotin/ascorbic acid (COLLAGEN 1500 PLUS C ORAL), Take 1,000 mg by mouth  cyanocobalamin (VITAMIN B 12) 1,000 mcg Oral Tablet, Take 1 Tablet (1,000 mcg total) by mouth Once a day  ferrous sulfate (FERATAB) 324 mg (65 mg iron) Oral Tablet, Delayed Release (E.C.), Take 1 Tablet (324 mg total) by mouth Twice a day before meals for 90 days  losartan (COZAAR) 25 mg Oral Tablet, Take 1 Tablet (25 mg total) by mouth Once a day for blood pressure  magnesium chloride (SLOW-MAG) 64 mg Oral Tablet, Delayed Release (E.C.), Take 1 Tablet (64 mg total) by mouth Once a day  metFORMIN (GLUCOPHAGE) 500 mg Oral Tablet, Take 2 Tablets (1,000 mg total) by mouth Twice daily  multivit-min-mfolate-K-herb289 (ALIVE WOMEN'S 50 PLUS ULTRA) 800 mcg DFE- 150 mcg Oral Tablet, Take 1 Tablet by mouth Once a day  omega-3 fatty acid (LOVAZA) 1 gram Oral Capsule, Take 2 Capsules (2 g total) by mouth Twice  daily  omeprazole (PRILOSEC) 20 mg Oral Capsule, Delayed Release(E.C.), Take 1 Capsule (20 mg total) by mouth Once a day  pioglitazone (ACTOS) 30 mg Oral Tablet, Take 1 Tablet (30 mg total) by mouth Once a day for 90 days  rosuvastatin (CRESTOR) 20 mg Oral Tablet, Take 1 Tablet (20 mg total) by mouth Once a day  potassium gluconate 600 mg (99 mg) Oral Tablet, Take 1 Tablet by mouth Once a day    No facility-administered medications prior to visit.      Allergies:   No Known Allergies      OBJECTIVE  BP 136/74   Pulse 99   Temp 36.2 C (97.1 F)   Resp 18   Ht 1.651 m (5\' 5" )   Wt 114 kg (252 lb)   SpO2 97%   BMI 41.93 kg/m       Physical Exam  Constitutional:       Appearance: She is obese.   Cardiovascular:      Rate and Rhythm: Normal rate and regular rhythm.      Pulses: Normal pulses.      Heart sounds: Normal heart sounds.   Pulmonary:      Effort: Pulmonary effort is normal.      Breath sounds: Normal breath sounds.   Abdominal:      General: Bowel  sounds are normal. There is no distension.      Tenderness: There is abdominal tenderness. There is no right CVA tenderness, left CVA tenderness or guarding.   Skin:     General: Skin is warm.      Capillary Refill: Capillary refill takes less than 2 seconds.   Neurological:      General: No focal deficit present.      Mental Status: She is alert and oriented to person, place, and time.   Psychiatric:         Mood and Affect: Mood normal.         Behavior: Behavior normal.           ASSESSMENT/PLAN  (N30.01) Acute cystitis with hematuria  (primary encounter diagnosis)  Plan: POCT Urine Dipstick, URINE CULTURE, XR KUB AND         UPRIGHT ABDOMEN    (R10.31) Right groin pain  Plan: XR KUB AND UPRIGHT ABDOMEN       Problem List Items Addressed This Visit          Nephrology    Acute cystitis with hematuria - Primary     Start Bactrim DS BID x 10 days for UTI.  Further treatment pending culture.  Hydrate well.  Limiting caffeine to no more than 12 oz daily.   Advised to follow up in 3 weeks or return clinic if symptoms progress.  Patient verbalized understanding.          Relevant Orders    POCT Urine Dipstick    URINE CULTURE    XR KUB AND UPRIGHT ABDOMEN       Musculoskeletal    Right groin pain     Further treatment pending KUB xray..         Relevant Orders    XR KUB AND UPRIGHT ABDOMEN      Orders Placed This Encounter    URINE CULTURE    XR KUB AND UPRIGHT ABDOMEN    POCT Urine Dipstick    trimethoprim-sulfamethoxazole (BACTRIM DS) 160-800mg  per tablet        Return in about 3 weeks (around 10/24/2022) for followup UTI/groin pain.    Stormy Fabian, NP-C 10/03/2022, 13:58

## 2022-10-03 NOTE — Assessment & Plan Note (Signed)
Start Bactrim DS BID x 10 days for UTI.  Further treatment pending culture.  Hydrate well.  Limiting caffeine to no more than 12 oz daily.  Advised to follow up in 3 weeks or return clinic if symptoms progress.  Patient verbalized understanding.

## 2022-10-06 LAB — URINE CULTURE: URINE CULTURE: >100000 — AB

## 2022-10-12 ENCOUNTER — Other Ambulatory Visit: Payer: Self-pay

## 2022-10-12 ENCOUNTER — Ambulatory Visit (RURAL_HEALTH_CENTER): Payer: Medicare Other | Attending: Family | Admitting: Family

## 2022-10-12 ENCOUNTER — Encounter (RURAL_HEALTH_CENTER): Payer: Self-pay | Admitting: Family

## 2022-10-12 ENCOUNTER — Other Ambulatory Visit (RURAL_HEALTH_CENTER): Payer: Self-pay | Admitting: Family

## 2022-10-12 VITALS — BP 143/70 | HR 80 | Temp 98.1°F | Resp 15 | Ht 66.0 in | Wt 256.0 lb

## 2022-10-12 DIAGNOSIS — Z9189 Other specified personal risk factors, not elsewhere classified: Secondary | ICD-10-CM | POA: Insufficient documentation

## 2022-10-12 DIAGNOSIS — Z780 Asymptomatic menopausal state: Secondary | ICD-10-CM | POA: Insufficient documentation

## 2022-10-12 DIAGNOSIS — Z1211 Encounter for screening for malignant neoplasm of colon: Secondary | ICD-10-CM

## 2022-10-12 DIAGNOSIS — R269 Unspecified abnormalities of gait and mobility: Secondary | ICD-10-CM | POA: Insufficient documentation

## 2022-10-12 DIAGNOSIS — Z13820 Encounter for screening for osteoporosis: Secondary | ICD-10-CM | POA: Insufficient documentation

## 2022-10-12 DIAGNOSIS — Z0000 Encounter for general adult medical examination without abnormal findings: Secondary | ICD-10-CM | POA: Insufficient documentation

## 2022-10-12 DIAGNOSIS — Z7189 Other specified counseling: Secondary | ICD-10-CM | POA: Insufficient documentation

## 2022-10-12 DIAGNOSIS — Z23 Encounter for immunization: Secondary | ICD-10-CM | POA: Insufficient documentation

## 2022-10-12 DIAGNOSIS — Z1239 Encounter for other screening for malignant neoplasm of breast: Secondary | ICD-10-CM

## 2022-10-12 DIAGNOSIS — Z9181 History of falling: Secondary | ICD-10-CM | POA: Insufficient documentation

## 2022-10-12 MED ORDER — SHINGRIX ADJUVANT COMPONENT (PF) INTRAMUSCULAR SUSPENSION
0.5000 mL | Freq: Once | INTRAMUSCULAR | 0 refills | Status: DC
Start: 2022-10-12 — End: 2022-10-19

## 2022-10-12 NOTE — Progress Notes (Addendum)
FAMILY MEDICINE, [facility]  [address]  [place] [address]  Operated by [facility]  Medicare Annual Wellness Visit    Name: Amber Owens MRN:  [id number]   Date: 10/12/2022 Age: 67 y.o.       SUBJECTIVE:   Amber Owens is a 67 y.o. female for presenting for Medicare Wellness exam.   I have reviewed and reconciled the medication list with the patient today.        10/12/2022     1:38 PM   Comprehensive Health Assessment-Adult   Do you wish to complete this form? Yes   During the past 4 weeks, how would you rate your health in general? Poor   During the past 4 weeks, how much difficulty have you had doing your usual activities inside and outside your home because of medical or emotional problems? Much difficulty   During the past 4 weeks, was someone available to help you if you needed and wanted help? Yes, as much as I wanted   In the past year, how many times have you gone to the emergency department or been admitted to a hospital for a health problem? None   Are you generally satisfied with your sleep? No   Do you have enough money to buy things you need in everyday life, such as food, clothing, medicines, and housing? Yes, always   Can you get to places beyond walking distance without help?  (For example, can you drive your own car or travel alone on buses)? Yes   Do you fasten your seatbelt when you are in a car? Yes, usually   Do you exercise 20 minutes 3 or more days per week (such as walking, dancing, biking, mowing grass, swimming)? Yes, most of the time   How often do you eat food that is healthy (fruits, vegetables, lean meats) instead of unhealthy (sweets, fast food, junk food, fatty foods)? Some of the time   Have your parents, brothers or sisters had any of the following problems before the age of 39? (check all that apply) Heart problems, or hardening of the arteries;Diabetes (sugar);Cancer;Alcohol or drug addiction (or abuse)   How often do  you have trouble taking medicines the eay you are told to take them? I always take them as prescribed   Do you need any help communicating with your doctors and nurses because of vision or hearing problems? No   Do you have one person you think of as your personal doctor (primary care provider or family doctor)? Yes   If you are seeing a Primary Care Provider (PCP) or family doctor. please list their name Lamont Dowdy   Are you now also seeing any specialist physician(s) (such as eye doctor, foot doctor, skin doctor)? Yes   If you are seeing a specialist for anything such as foot, eye, skin, etc.  please list their name(s) ortho   How confident are you that you can control or manage most of your health problems? Somewhat confident       I have reviewed and updated as appropriate the past medical, family and social history. 10/12/2022 as summarized below:  Past Medical History:   Diagnosis Date    Abdominal pain     Anxiety     Anxiety     B12 deficiency     Chronic back pain     Chronic constipation     Chronic left hip pain     COVID  Cystocele with rectocele     Diabetes mellitus, type 2 (CMS HCC)     Diabetic neuropathy, type II diabetes mellitus (CMS HCC)     Encounter for screening for malignant neoplasm of breast     Esophageal reflux     Female bladder prolapse     Hypercholesterolemia     Hypertension     Intermittent claudication (CMS HCC)     Leukocytes in urine 08/16/2022    Morbid obesity with BMI of 40.0-44.9, adult (CMS HCC)     OAB (overactive bladder)     Osteoarthritis of spinal facet joint     Steatosis of liver     Vaginal vault prolapse     Vitamin D deficiency      Past Surgical History:   Procedure Laterality Date    Bladder surgery  11/2019    Colonoscopy      Hx appendectomy      Hx hysterectomy  1995    Hx tubal ligation Bilateral 1993    Repair rectocele  2023     Current Outpatient Medications   Medication Sig    adjuvant AS01B, PF,vial 1 of 2 (SHINGRIX ADJUVANT COMPONENT-PF)  IntraMUSCULAR Suspension Inject 0.5 mL into the muscle One time for 1 dose Followed by a second dose in 2-6 months    aspirin (ECOTRIN) 81 mg Oral Tablet, Delayed Release (E.C.) Take 1 Tablet (81 mg total) by mouth Once a day    cholecalciferol, vitamin D3, 25 mcg (1,000 unit) Oral Tablet Take 1 Tablet (1,000 Units total) by mouth Once a day    collagen/biotin/ascorbic acid (COLLAGEN 1500 PLUS C ORAL) Take 1,000 mg by mouth    cyanocobalamin (VITAMIN B 12) 1,000 mcg Oral Tablet Take 1 Tablet (1,000 mcg total) by mouth Once a day    ferrous sulfate (FERATAB) 324 mg (65 mg iron) Oral Tablet, Delayed Release (E.C.) TAKE 1 TABLET (324 MG TOTAL) BY MOUTH TWICE A DAY BEFORE MEALS    losartan (COZAAR) 25 mg Oral Tablet Take 1 Tablet (25 mg total) by mouth Once a day for blood pressure    magnesium chloride (SLOW-MAG) 64 mg Oral Tablet, Delayed Release (E.C.) Take 1 Tablet (64 mg total) by mouth Once a day    metFORMIN (GLUCOPHAGE) 500 mg Oral Tablet Take 2 Tablets (1,000 mg total) by mouth Twice daily    multivit-min-mfolate-K-herb289 (ALIVE WOMEN'S 50 PLUS ULTRA) 800 mcg DFE- 150 mcg Oral Tablet Take 1 Tablet by mouth Once a day    omega-3 fatty acid (LOVAZA) 1 gram Oral Capsule Take 2 Capsules (2 g total) by mouth Twice daily    omeprazole (PRILOSEC) 20 mg Oral Capsule, Delayed Release(E.C.) Take 1 Capsule (20 mg total) by mouth Once a day    pioglitazone (ACTOS) 30 mg Oral Tablet Take 1 Tablet (30 mg total) by mouth Once a day for 90 days    rosuvastatin (CRESTOR) 20 mg Oral Tablet Take 1 Tablet (20 mg total) by mouth Once a day    trimethoprim-sulfamethoxazole (BACTRIM DS) 160-800mg  per tablet Take 1 Tablet (160 mg total) by mouth Twice daily for 10 days     Family Medical History:       Problem Relation (Age of Onset)    Breast Cancer Maternal Aunt    No Known Problems Mother, Father, Sister, Brother, Maternal Grandmother, Maternal Grandfather, Paternal Grandmother, Paternal Grandfather, Daughter, Son, Maternal Uncle,  Paternal Aunt, Paternal Uncle, Other            Social  History     Socioeconomic History    Marital status: Married     Spouse name: Tossie Lantrip    Number of children: 1    Highest education level: 12th grade   Tobacco Use    Smoking status: Never    Smokeless tobacco: Never   Substance and Sexual Activity    Alcohol use: Never    Drug use: Never    Sexual activity: Not Currently     Partners: Male     Social Determinants of Health     Financial Resource Strain: Low Risk  (10/12/2022)    Financial Resource Strain     SDOH Financial: No   Transportation Needs: Low Risk  (10/12/2022)    Transportation Needs     SDOH Transportation: No   Social Connections: Low Risk  (10/12/2022)    Social Connections     SDOH Social Isolation: 5 or more times a week   Intimate Partner Violence: Low Risk  (10/12/2022)    Intimate Partner Violence     SDOH Domestic Violence: No   Recent Concern: Intimate Partner Violence - High Risk (08/16/2022)    Intimate Partner Violence     SDOH Domestic Violence: No   Housing Stability: Low Risk  (10/12/2022)    Housing Stability     SDOH Housing Situation: I have housing.     SDOH Housing Worry: No   Health Literacy: Low Risk  (12/12/2021)    Health Literacy     SDOH Health Literacy: Never   Employment Status: Low Risk  (10/12/2022)    Employment Status     SDOH Employment: Otherwise unemployed but not seeking work (ex. Consulting civil engineer, retired, disabled, unpaid primary care giver)         List of Current Health Care Providers   Care Team       PCP       Name Type Specialty Phone Number    Stormy Fabian, NP-C Nurse Practitioner NURSE PRACTITIONER [phone]              Care Team       Name Type Specialty Phone Number    Vianne Bulls Not available Not available Not available    Alexia Freestone, MD Physician [facility] SURGERY [phone]                      Health Maintenance   Topic Date Due    Diabetic Retinal Exam  Never done    Diabetic A1C  Never done    Diabetic Kidney Health Microalb/Cr Ratio   Never done    Colonoscopy  Never done    Medicare Annual Wellness Visit  Never done    Influenza Vaccine (Season Ended) 12/31/2022    Mammography  08/18/2023    Depression Screening  10/12/2023    Meningococcal Vaccine  Aged Out    Adult Tdap-Td  Discontinued    Diabetic Kidney Health eGFR  Discontinued    Osteoporosis screening  Discontinued    Hepatitis C screening  Discontinued    Shingles Vaccine  Discontinued    Covid-19 Vaccine  Discontinued    Pneumococcal Vaccination, Age 60+  Discontinued     Medicare Wellness Assessment   Medicare initial or wellness physical in the last year?: Yes  Advance Directives   Does patient have a living will or MPOA: No           Advance directive information given to the patient today?: Patient Declined  Activities of Daily Living   Do you need help with dressing, bathing, or walking?: Yes   Do you need help with shopping, housekeeping, medications, or finances?: Yes   Do you have rugs in hallways, broken steps, or poor lighting?: Yes (rugs)   Do you have grab bars in your bathroom, non-slip strips in your tub, and hand rails on your stairs?: Yes   Cognitive Function Screen (1=Yes, 0=No)   What is you age?: Correct   What is the time to the nearest hour?: Correct   What is the year?: Correct   What is the name of this clinic?: Correct   Can the patient recognize two persons (the doctor, the nurse, home help, etc.)?: Correct   What is the date of your birth? (day and month sufficient) : Correct   In what year did World War II end?: Incorrect   Who is the current president of the [place]?: Correct   Count from 20 down to 1?: Correct   What address did I give you earlier?: Correct   Total Score: 9   Interpretation of Total Score: Greater than 6 Normal   Fall Risk Screen   Do you feel unsteady when standing or walking?: Yes  Do you worry about falling?: Yes  Have you fallen in the past year?: No   Depression Screen     Little interest or pleasure in doing things.: Not at  all  Feeling down, depressed, or hopeless: Not at all  PHQ 2 Total: 0     Pain Score   Pain Score:   9 (back)    Substance Use-Abuse Screening     Tobacco Use     In Past 12 MONTHS, how often have you used any tobacco product (for example, cigarettes, e-cigarettes, cigars, pipes, or smokeless tobacco)?: Never     Alcohol use     In the PAST 12 MONTHS, how often have you had 5 (men)/4 (women) or more drinks containing alcohol in one day?: Never     Prescription Drug Use     In the PAST 12 months, how often have you used any prescription medications just for the feeling, more than prescribed, or that were not prescribed for you? Prescriptions may include: opioids, benzodiazepines, medications for ADHD: Never           Illicit Drug Use   In the PAST 12 MONTHS, how often have you used any drugs, including marijuana, cocaine or crack, heroin, methamphetamine, hallucinogens, ecstasy/MDMA?: Never        Hearing Screen   Have you noticed any hearing difficulties?: Yes  After whispering 9-1-6 how many numbers did the patient repeat correctly?: 3       Vision Screen   Right Eye = 20: 0.8   Left Eye = 20: 1     Urine Incontinence Screen   Urinary Incontinence Screen  Do you ever leak urine when you don't want to?: YES        OBJECTIVE:   BP (!) 143/70   Pulse 80   Temp 36.7 C (98.1 F)   Resp 15   Ht 1.676 m (5\' 6" )   Wt 116 kg (256 lb)   SpO2 96%   BMI 41.32 kg/m   Vision screening: Right eye 20/25, left eye 20/20.     Other appropriate exam:    Health Maintenance Due   Topic Date Due    Diabetic Retinal Exam  Never done    Diabetic A1C  Never done    Diabetic Kidney Health Microalb/Cr Ratio  Never done    Colonoscopy  Never done    Medicare Annual Wellness Visit  Never done      ASSESSMENT & PLAN:   Assessment/Plan   1. Medicare annual wellness visit, subsequent    2. Advanced care planning/counseling discussion    3. Breast cancer screening    4. Need for shingles vaccine    5. Osteoporosis screening    6.  Postmenopausal status    7. Gait abnormality    8. History of falling    9. Cardiovascular event risk    10. Colon cancer high risk    11. Risk for falls    12. Colon cancer screening       Identified Risk Factors/ Recommended Actions     Fall Risk Follow up plan of care: Vitamin D supplementation advised  Discussed optimizing home safety  Manage & monitor hypotension  Medications managed to minimize fall risk  Footwear and potential problems addressed  The PHQ 2 Total: 0 depression screen is interpreted as negative.    Urinary Incontinence Plan of Care: Modified/Discontinued medication contributing to incontinence    Addressed comorbid conditions   Lifestyle modifications  Reassess at follow up visit    Patient declined Advanced Directives information.  Advanced directives reviewed and discussed, advise to have a copy notarized and return to clinic.    Patient declines all vaccines, we will consider Shingrix vaccine.  Prescription sent to pharmacy.    Reports diabetic eye exam, by Dr. Bufford Spikes; will request copy.    Orders Placed This Encounter    DEXA BONE DENSITOMETRY associated with postmenopausal osteoporosis screening    MAMMO BILATERAL SCREENING-ADDL VIEWS/BREAST US AS REQ BY RAD associated breast cancer screening    Referral to External Provider    adjuvant AS01B, PF,vial 1 of 2 (SHINGRIX ADJUVANT COMPONENT-PF) IntraMUSCULAR Suspension   Referred to Dr. Concha Se for screening colonoscopy.  Last colonoscopy 2018 with 5 year follow-up-history of colonic polyps.      Advise a low fat diet, scheduled 20 min of daily physical activity and to maintain a healthy weight.        The patient has been educated about risk factors and recommended preventive care. Written Prevention Plan completed/ updated and given to patient (see After Visit Summary).    Return in 1 year (on 10/12/2023) for Medicare Well Visit.    Stormy Fabian, NP-C

## 2022-10-12 NOTE — Patient Instructions (Signed)
Medicare Preventive Services  Medicare coverage information Recommendation for YOU   Heart Disease and Diabetes   Lipid profile Every 5 years or more often if at risk for cardiovascular disease   No results found for: "CHOLESTEROL", "HDLCHOL", "LDLCHOL", "LDLCHOLDIR", "TRIG"      Diabetes Screening    Yearly for those at risk for diabetes, 2 tests per year for those with prediabetes Last Glucose:      Diabetes Self Management Training or Medical Nutrition Therapy  For those with diabetes, up to 10 hrs initial training within a year, subsequent years up to 2 hrs of follow up training Optional for those with diabetes     Medical Nutrition Therapy  Three hours of one-on-one counseling in first year, two hours in subsequent years Optional for those with diabetes, kidney disease   Intensive Behavioral Therapy for Obesity  Face-to-face counseling, first month every week, month 2-6 every other week, month 7-12 every month if continued progress is documented Optional for those with Body Mass Index 30 or higher  Your Body mass index is 41.32 kg/m.   Tobacco Cessation (Quitting) Counseling   Covers up to 8 smoking and tobacco-use cessation counseling sessions in a 74-month period.    Optional for those that use tobacco   Cancer Screening Last Completion Date   Colorectal screening   For anyone age 75 to 35 or any age if high risk:  Screening Colonoscopy every 10 yrs if low risk,  more frequent if higher risk  OR  Cologuard Stool DNA test once every 3 years OR  Fecal Occult Blood Testing yearly OR  Flexible  Sigmoidoscopy  every 5 yr OR  CT Colonography every 5 yrs    ?  See below for due date if applicable.   Screening Pap Test   Recommended every 3 years for all women age 82 to 65, or every five years if combined with HPV test (routine screening not needed after total hysterectomy).  Medicare covers every 2 years or yearly if high risk.  Screening Pelvic Exam   Medicare covers every 2 years, yearly if high risk or  childbearing age with abnormal Pap in last 3 yrs.   ?  See below for due date if applicable.   Screening Mammogram   Recommended every 2 years for women age 43 to 51, or more frequent if you have a higher risk. Selectively recommended for women between 40-49 based on shared decisions about risk. Covered by Medicare up to every year for women age 49 or older --08/17/2021  See below for due date if applicable.         Lung Cancer Screening  Annual low dose computed tomography (LDCT scan) is recommended for those age 67-80 who smoked 20 pack-years and are current smokers or quit smoking within past 15 years, after counseling by your doctor or nurse clinician about the possible benefits or harms.   ?  See below for due date if applicable.   Vaccinations   Respiratory syncytial virus (RSV)  Age 26 years or older: Based on shared clinical decision-making with your provider.  Pneumococcal Vaccine  Recommended routinely age 55+ with one or two separate vaccines based on your risk. Recommended before age 32 if medical conditions with increased risk  Seasonal Influenza Vaccine  Once every flu season   Hepatitis B Vaccine  3 doses if risk (including anyone with diabetes or liver disease)  Shingles Vaccine  Two doses at age 37 or older  Diphtheria Tetanus  Pertussis Vaccine  ONCE as adult, booster every 10 years     There is no immunization history on file for this patient.  Shingles vaccine and Diphtheria Tetanus Pertussis vaccines are available at pharmacies or local health department without a prescription.   Other Preventative Screening  Last Completion Date   Bone Densitometry   Screening: All females ages 28 and older every 10 years if initial screening normal. Postmenopausal women ages 61-64 need screening with one or more risk factor: previous fracture, parental hip fracture, current smoker, low body weight, excessive alcohol use, Rheumatoid Arthritis   For women with diagnosed Osteoporosis, follow up is recommended  every 2 years or a frequency recommended by your provider.     ?  See below for due date if applicable.     Glaucoma Screening   Yearly if in high risk group such as diabetes, family history, African American age 4+ or Hispanic American age 20+   See your eye care provider for screening.   Hepatitis C Screening   Recommended  for those born between ages 18-79 years.   ?  See below for due date if applicable.     HIV Testing  Recommended routinely at least ONCE, covered every year for age 73 to 60 regardless of risk, and every year for age over 71 who ask for the test or higher risk. Yearly or up to 3 times in pregnancy       ?  See below for due date if applicable.   Abdominal Aortic Aneurysm Screening Ultrasound   Once with a family history of abdominal aortic aneurysms OR a female between65-75 and have smoked at least 100 cigarettes in your lifetime.       ?  See below for due date if applicable.       Your Personalized Schedule for Preventive Tests   Health Maintenance: Pending and Last Completed       Date Due Completion Date    Diabetic Retinal Exam Never done ---    Diabetic A1C Never done ---    Diabetic Kidney Health Microalb/Cr Ratio Never done ---    Colonoscopy Never done ---    Medicare Annual Wellness Visit Never done ---    Influenza Vaccine (Season Ended) 12/31/2022 ---    Mammography 08/18/2023 08/17/2021    Depression Screening 10/12/2023 10/12/2022                For Information on Advanced Directives for Health Care:  [place]:  [email or web address]  [place], [place], [place], [place] General Information: [email or web address]

## 2022-10-19 ENCOUNTER — Encounter (RURAL_HEALTH_CENTER): Payer: Self-pay | Admitting: Family

## 2022-10-19 ENCOUNTER — Ambulatory Visit (RURAL_HEALTH_CENTER): Payer: Medicare Other | Attending: Family | Admitting: Family

## 2022-10-19 ENCOUNTER — Other Ambulatory Visit: Payer: Self-pay

## 2022-10-19 VITALS — BP 136/73 | HR 80 | Temp 97.3°F | Resp 15 | Ht 65.0 in | Wt 258.0 lb

## 2022-10-19 DIAGNOSIS — R1031 Right lower quadrant pain: Secondary | ICD-10-CM | POA: Insufficient documentation

## 2022-10-19 DIAGNOSIS — G8929 Other chronic pain: Secondary | ICD-10-CM | POA: Insufficient documentation

## 2022-10-19 LAB — POCT URINE DIPSTICK
BILIRUBIN: NEGATIVE
BLOOD: NEGATIVE
GLUCOSE: NEGATIVE
NITRITE: NEGATIVE
PH: 5.5
PROTEIN: NEGATIVE
SPECIFIC GRAVITY: 1.02
UROBILINOGEN: 1

## 2022-10-23 ENCOUNTER — Encounter (RURAL_HEALTH_CENTER): Payer: Self-pay | Admitting: Family

## 2022-10-23 NOTE — Assessment & Plan Note (Addendum)
Discussed x-ray dated 10/03/2022 indicated a 1.7 left lower pole renal calculus with severe degenerative changes in the lumbar spine.  No right lower quadrant abnormality was noted.  Abdominal and pelvic CT with contrast ordered today.  Further treatment pending CT results.  Advised patient she develops worsening pain to go to emergency room for further evaluation.  Discussed urinalysis, within normal limits.  Advised over-the-counter Tylenol as needed for pain.  Advised to follow up after CT return to clinic sooner if needed.

## 2022-10-23 NOTE — Progress Notes (Addendum)
[facility] MEDICINE [facility]  [facility] FAMILY MEDICINE      Amber Owens  MRN: [id number]  DOB: June 08, 1955  Date of Service: 10/23/2022    CHIEF COMPLAINT  Chief Complaint   Patient presents with    Follow Up     F/u on groin pain and uit       SUBJECTIVE  Amber Owens is a 67 y.o. female who presents to clinic for follow-up on urinary tract infection and chronic right groin pain greater than 1 year.  Patient has completed 10 days of antibiotic therapy and urinary tract infection has completely resolve.  Patient is asymptomatic.    Patient complains of right lower quadrant pain greater than 1 year.  Patient reports a regular diet and denies scheduled exercise.  Patient reports right lower quadrant pain has been consistent for greater than 6 months and has been intermittent for greater than 1 year.  Denies dyspareunia.Patient reports chronic back pain and history of hysterectomy with bilateral tubal ligation.  History of rectocele.  Patient reports normal bowel movements and normal voiding urine.      Review of Systems:  Positive ROS discussed in HPI, otherwise all other systems negative.      Medications:   aspirin (ECOTRIN) 81 mg Oral Tablet, Delayed Release (E.C.), Take 1 Tablet (81 mg total) by mouth Once a day  cholecalciferol, vitamin D3, 25 mcg (1,000 unit) Oral Tablet, Take 1 Tablet (1,000 Units total) by mouth Once a day  collagen/biotin/ascorbic acid (COLLAGEN 1500 PLUS C ORAL), Take 1,000 mg by mouth  cyanocobalamin (VITAMIN B 12) 1,000 mcg Oral Tablet, Take 1 Tablet (1,000 mcg total) by mouth Once a day  ferrous sulfate (FERATAB) 324 mg (65 mg iron) Oral Tablet, Delayed Release (E.C.), TAKE 1 TABLET (324 MG TOTAL) BY MOUTH TWICE A DAY BEFORE MEALS  losartan (COZAAR) 25 mg Oral Tablet, Take 1 Tablet (25 mg total) by mouth Once a day for blood pressure  magnesium chloride (SLOW-MAG) 64 mg Oral Tablet, Delayed Release (E.C.), Take 1 Tablet (64 mg total) by mouth Once a day  metFORMIN  (GLUCOPHAGE) 500 mg Oral Tablet, Take 2 Tablets (1,000 mg total) by mouth Twice daily  multivit-min-mfolate-K-herb289 (ALIVE WOMEN'S 50 PLUS ULTRA) 800 mcg DFE- 150 mcg Oral Tablet, Take 1 Tablet by mouth Once a day  omega-3 fatty acid (LOVAZA) 1 gram Oral Capsule, Take 2 Capsules (2 g total) by mouth Twice daily  omeprazole (PRILOSEC) 20 mg Oral Capsule, Delayed Release(E.C.), Take 1 Capsule (20 mg total) by mouth Once a day  pioglitazone (ACTOS) 30 mg Oral Tablet, Take 1 Tablet (30 mg total) by mouth Once a day for 90 days  rosuvastatin (CRESTOR) 20 mg Oral Tablet, Take 1 Tablet (20 mg total) by mouth Once a day  adjuvant AS01B, PF,vial 1 of 2 (SHINGRIX ADJUVANT COMPONENT-PF) IntraMUSCULAR Suspension, Inject 0.5 mL into the muscle One time for 1 dose Followed by a second dose in 2-6 months  trimethoprim-sulfamethoxazole (BACTRIM DS) 160-800mg  per tablet, Take 1 Tablet (160 mg total) by mouth Twice daily for 10 days    No facility-administered medications prior to visit.      Allergies:   No Known Allergies      OBJECTIVE  BP 136/73   Pulse 80   Temp 36.3 C (97.3 F)   Resp 15   Ht 1.651 m (5\' 5" )   Wt 117 kg (258 lb)   SpO2 98%   BMI 42.93 kg/m  Physical Exam  Constitutional:       Appearance: She is obese.   Cardiovascular:      Rate and Rhythm: Normal rate and regular rhythm.      Pulses: Normal pulses.      Heart sounds: Normal heart sounds.   Pulmonary:      Effort: Pulmonary effort is normal.      Breath sounds: Normal breath sounds.   Abdominal:      General: Bowel sounds are normal. There is no distension.      Palpations: There is no mass.      Tenderness: There is abdominal tenderness. There is no right CVA tenderness, left CVA tenderness, guarding or rebound.      Hernia: No hernia is present.   Skin:     General: Skin is warm.      Capillary Refill: Capillary refill takes less than 2 seconds.   Neurological:      General: No focal deficit present.      Mental Status: She is alert and  oriented to person, place, and time.   Psychiatric:         Mood and Affect: Mood normal.         Behavior: Behavior normal.           ASSESSMENT/PLAN  (R10.31,  G89.29) Chronic right lower quadrant pain  (primary encounter diagnosis)  Plan: POCT Urine Dipstick, CT ABDOMEN PELVIS W IV         CONTRAST       Problem List Items Addressed This Visit          Digestive    Chronic right lower quadrant pain - Primary     Discussed x-ray dated 10/03/2022 indicated a 1.7 left lower pole renal calculus with severe degenerative changes in the lumbar spine.  No right lower quadrant abnormality was noted.  Abdominal and pelvic CT with contrast ordered today.  Further treatment pending CT results.  Advised patient she develops worsening pain to go to emergency room for further evaluation.  Discussed urinalysis, within normal limits.  Advised over-the-counter Tylenol as needed for pain.  Advised to follow up after CT return to clinic sooner if needed.         Relevant Orders    POCT Urine Dipstick (Completed)    CT ABDOMEN PELVIS W IV CONTRAST      Orders Placed This Encounter    CT ABDOMEN PELVIS W IV CONTRAST    POCT Urine Dipstick        Return if symptoms worsen or fail to improve, for Routine appointment scheduled for 12/27/2022.    Stormy Fabian, NP-C 10/23/2022, 07:35

## 2022-10-25 ENCOUNTER — Encounter (HOSPITAL_COMMUNITY): Payer: Self-pay

## 2022-10-25 ENCOUNTER — Inpatient Hospital Stay (HOSPITAL_COMMUNITY)
Admission: RE | Admit: 2022-10-25 | Discharge: 2022-10-25 | Disposition: A | Discharge status: Home / Self Care | Payer: Medicare Other | Source: Ambulatory Visit | Attending: Family

## 2022-10-25 ENCOUNTER — Other Ambulatory Visit: Payer: Self-pay

## 2022-10-25 ENCOUNTER — Inpatient Hospital Stay
Admission: RE | Admit: 2022-10-25 | Discharge: 2022-10-25 | Disposition: A | Discharge status: Home / Self Care | Payer: Medicare Other | Source: Ambulatory Visit | Attending: Family | Admitting: Family

## 2022-10-25 DIAGNOSIS — Z13820 Encounter for screening for osteoporosis: Secondary | ICD-10-CM

## 2022-10-25 DIAGNOSIS — Z1239 Encounter for other screening for malignant neoplasm of breast: Secondary | ICD-10-CM

## 2022-10-25 DIAGNOSIS — Z1231 Encounter for screening mammogram for malignant neoplasm of breast: Secondary | ICD-10-CM | POA: Insufficient documentation

## 2022-10-25 DIAGNOSIS — Z780 Asymptomatic menopausal state: Secondary | ICD-10-CM | POA: Insufficient documentation

## 2022-11-09 IMAGING — CT CT ABDOMEN & PELVIS WITH CONTRAST
2 of 4 series · 17 of 46 positions shown, 19 images · IV contrast (CONTRAST)
Comparison: KUB dated 10/03/2022.

﻿EXAM:  CT ABDOMEN & PELVIS WITH CONTRAST
INDICATION: 67-year-old with history of hematuria.  Right lower quadrant pain. Previous history of rectocele.  Finding of left renal calculus on KUB.
TECHNIQUE: CT was performed before and after administration of 70 cc of Ultravist 300 and after oral contrast and reviewed in multiple projections. Exam was performed using 1 or more of the following dose reduction techniques: Automated exposure control, adjustment of the mA and/or kV according to patient size, or the use of iterative reconstruction technique.  Radiation dose 1700.8 mGy cm.

[Series 8048: axial delay · axial · delayed · 0.94mm/px · z∈[-883,-499]mm · 14 of 148 slices shown, 16 images]
[im 10/148  soft-tissue]
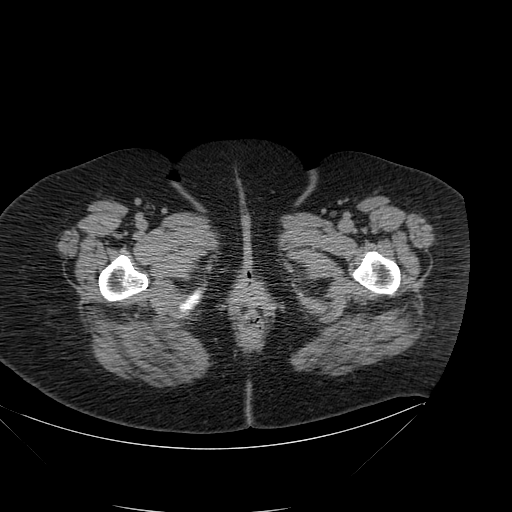
[im 10/148  bone]
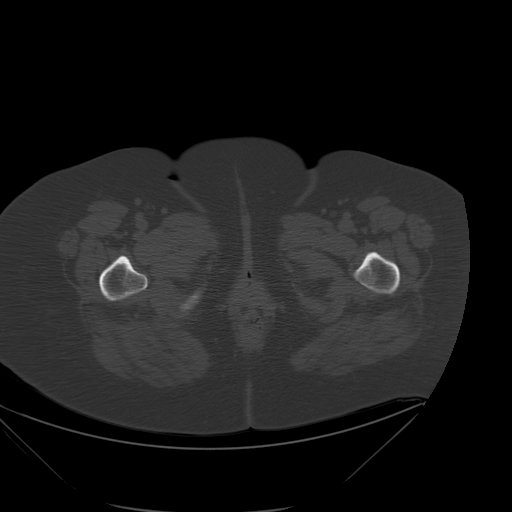
[im 20/148  soft-tissue]
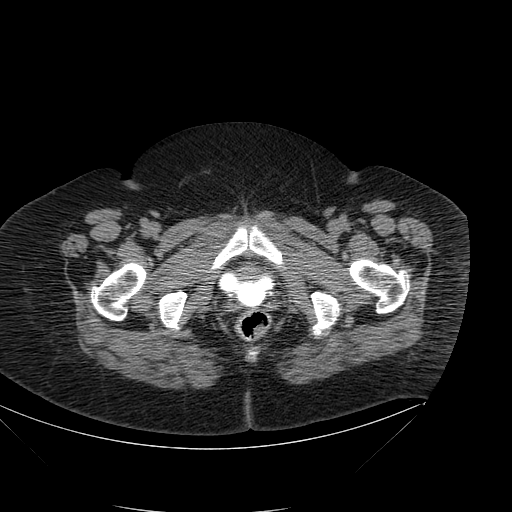
[im 30/148  soft-tissue]
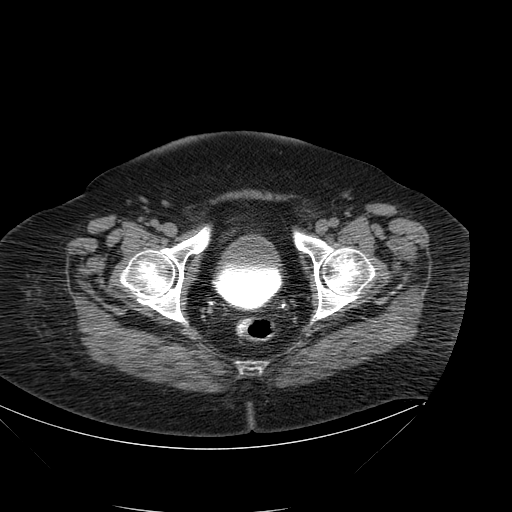
[im 40/148  soft-tissue]
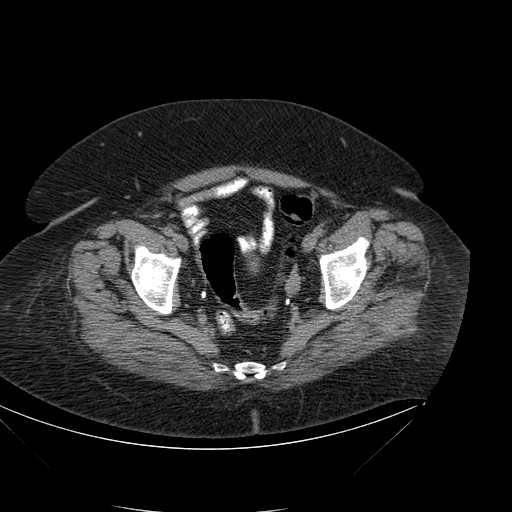
[im 50/148  soft-tissue]
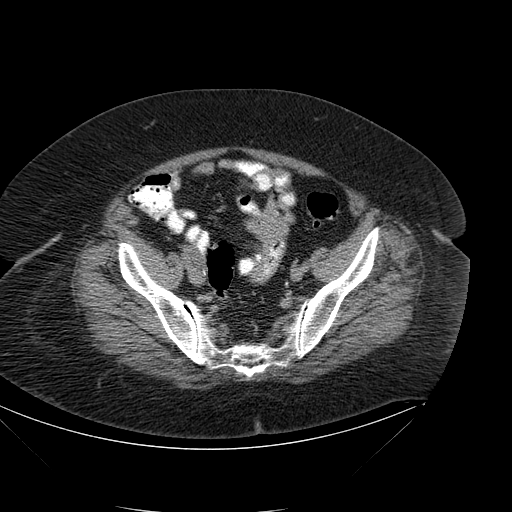
[im 59/148  soft-tissue]
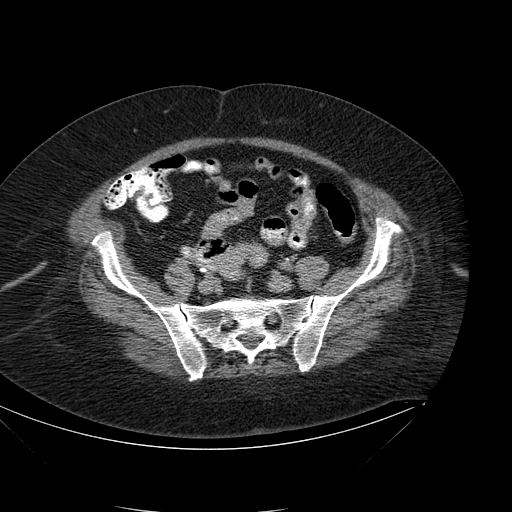
[im 69/148  soft-tissue]
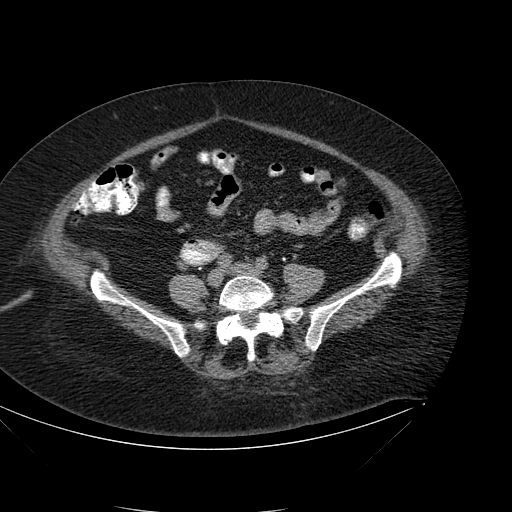
[im 79/148  soft-tissue]
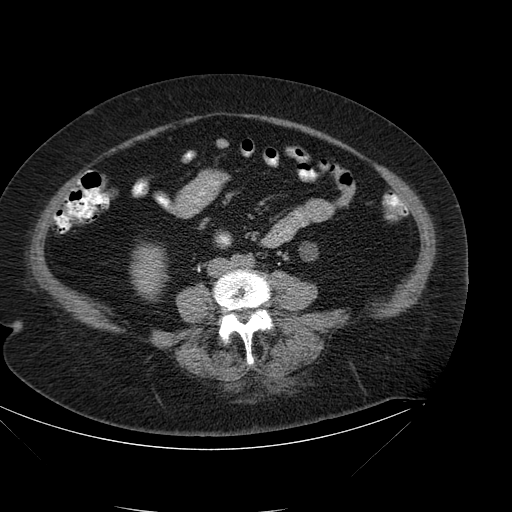
[im 89/148  soft-tissue]
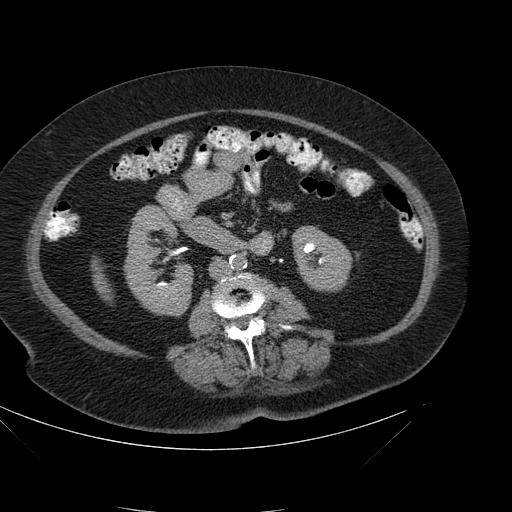
[im 89/148  bone]
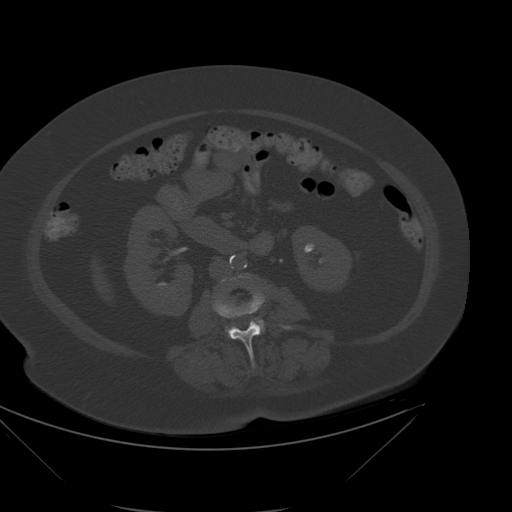
[im 99/148  soft-tissue]
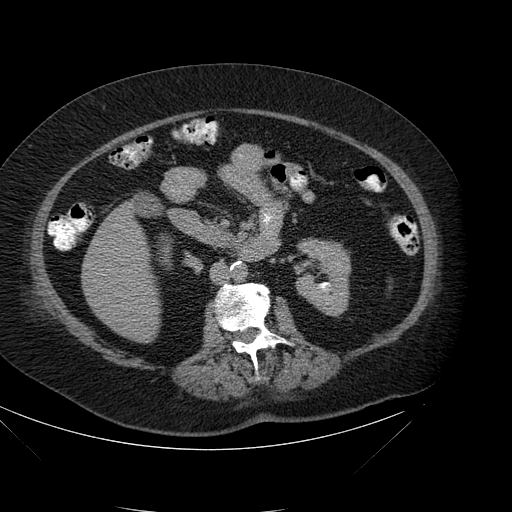
[im 108/148  soft-tissue]
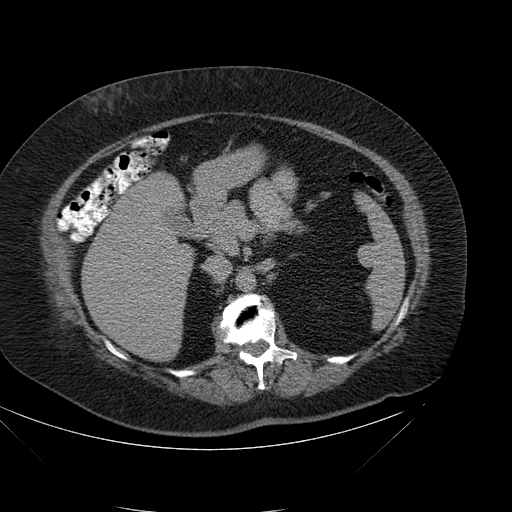
[im 118/148  soft-tissue]
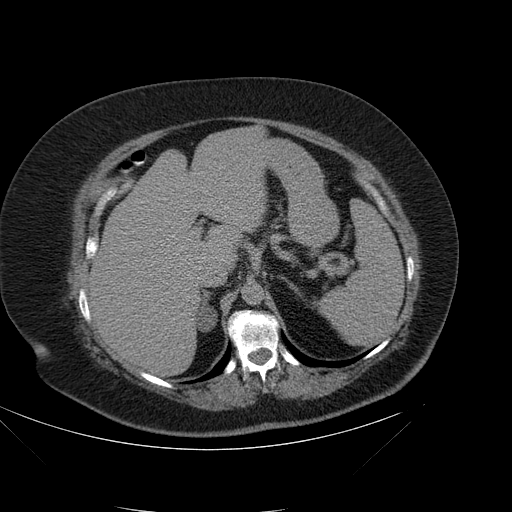
[im 128/148  soft-tissue]
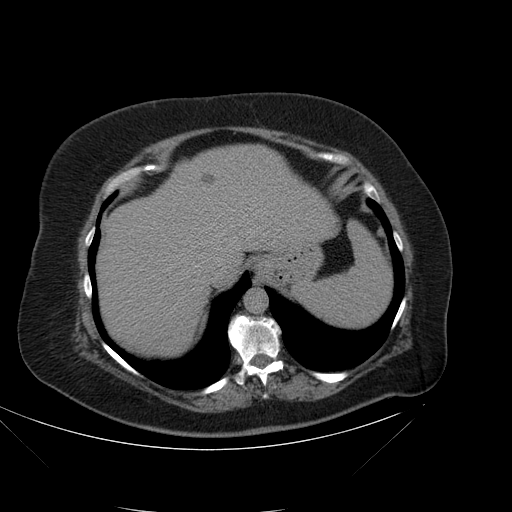
[im 138/148  soft-tissue]
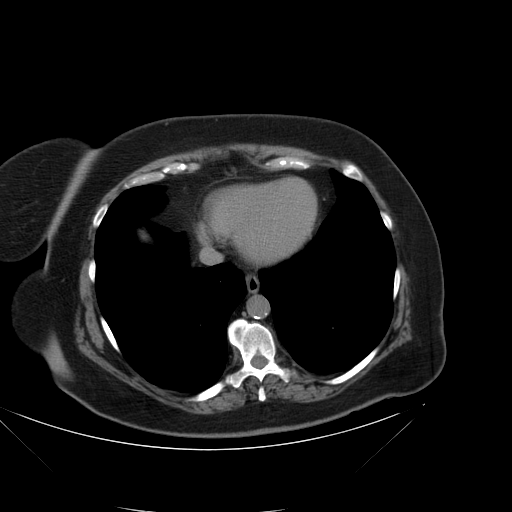

[cor · coronal · 0.94mm/px · 3 of 60 slices shown]
[im 20/60  soft-tissue]
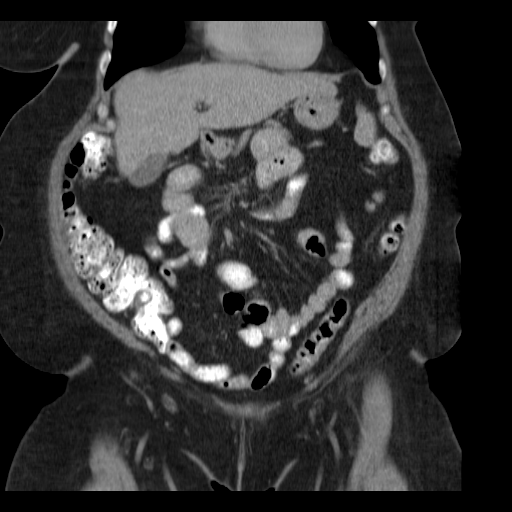
[im 27/60  soft-tissue]
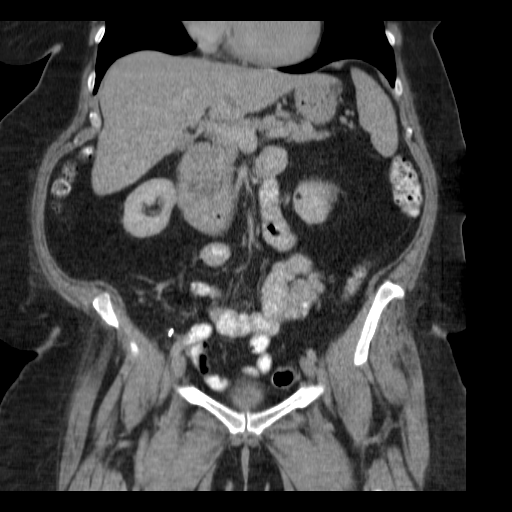
[im 33/60  soft-tissue]
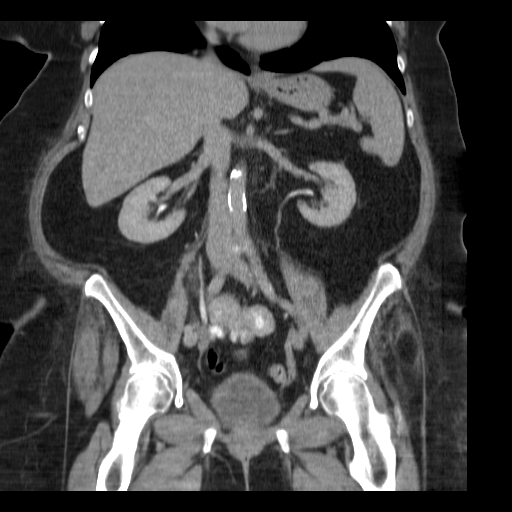

[17 of 46 positions shown; findings below may reference images not displayed]

FINDINGS: No focal lesions of liver and spleen other than 1 cm size benign cyst in the left lobe of the liver.  Fatty liver. 

Gallbladder and pancreas are normal. 

Well-defined fat-containing 2 cm nodule of the right adrenal gland suggestive of adrenal adenoma.

Bilateral multiple nonobstructing renal calculi are noted. Largest calculus on the left side measures 12 mm in diameter.  No evidence of ureteric or bladder calculi are seen.

No evidence of bowel obstruction.  No inflammatory changes in the right lower quadrant.  The uterus is absent. No pelvic mass or fluid collections are seen.

Significant multilevel degenerative disc changes of upper and mid lumbar discs are noted.  Significant compromise of spinal canal by osteophyte complex at L1-2 and L2-3 levels.
IMPRESSION: 1.  Fatty liver. Small benign cysts of the left lobe of the liver. 

2. Bilateral nonobstructing renal calculi measuring up to 12 mm in size in the left kidney.  Benign cyst in the lower pole of left kidney.

3. Severe multilevel degenerative disc disease of lumbar spine as mentioned above.

## 2022-11-13 ENCOUNTER — Telehealth (RURAL_HEALTH_CENTER): Payer: Self-pay | Admitting: Family

## 2022-11-14 ENCOUNTER — Ambulatory Visit (RURAL_HEALTH_CENTER): Payer: Medicare Other | Attending: Family | Admitting: Family

## 2022-11-14 ENCOUNTER — Other Ambulatory Visit: Payer: Self-pay

## 2022-11-14 ENCOUNTER — Encounter (RURAL_HEALTH_CENTER): Payer: Self-pay | Admitting: Family

## 2022-11-14 DIAGNOSIS — M539 Dorsopathy, unspecified: Secondary | ICD-10-CM | POA: Insufficient documentation

## 2022-11-14 DIAGNOSIS — G8929 Other chronic pain: Secondary | ICD-10-CM | POA: Insufficient documentation

## 2022-11-14 DIAGNOSIS — K219 Gastro-esophageal reflux disease without esophagitis: Secondary | ICD-10-CM

## 2022-11-14 DIAGNOSIS — Z780 Asymptomatic menopausal state: Secondary | ICD-10-CM | POA: Insufficient documentation

## 2022-11-14 DIAGNOSIS — R1031 Right lower quadrant pain: Secondary | ICD-10-CM | POA: Insufficient documentation

## 2022-11-14 DIAGNOSIS — M5030 Other cervical disc degeneration, unspecified cervical region: Secondary | ICD-10-CM | POA: Insufficient documentation

## 2022-11-14 DIAGNOSIS — M5136 Other intervertebral disc degeneration, lumbar region: Secondary | ICD-10-CM | POA: Insufficient documentation

## 2022-11-14 DIAGNOSIS — M2578 Osteophyte, vertebrae: Secondary | ICD-10-CM | POA: Insufficient documentation

## 2022-11-14 DIAGNOSIS — M8580 Other specified disorders of bone density and structure, unspecified site: Secondary | ICD-10-CM | POA: Insufficient documentation

## 2022-11-14 DIAGNOSIS — K760 Fatty (change of) liver, not elsewhere classified: Secondary | ICD-10-CM | POA: Insufficient documentation

## 2022-11-14 NOTE — Assessment & Plan Note (Signed)
Managed by Dr. Concha Se.  Patient reports she is scheduled to have an EEG and a colonoscopy in the upcoming weeks.  Continue current medications prescribed by Gastroenterology.

## 2022-11-14 NOTE — Progress Notes (Addendum)
[facility] MEDICINE [facility]  [facility] FAMILY MEDICINE      Amber Owens  MRN: [id number]  DOB: 08-27-1955  Date of Service: 11/14/2022    CHIEF COMPLAINT  Chief Complaint   Patient presents with    Follow Up     CT results       The patient/family initiated a request for telephone service.  Verbal consent for this service was obtained from the patient/family.    I personally offered the service to the patient, and obtained verbal consent to provide this service.  Stormy Fabian, NP-C         SUBJECTIVE  Amber Owens is a 67 y.o. female who presents to clinic for right lower quadrant pain.  CT of abdomen and pelvis with contrast done on 11/09/2022.  Patient denies dysuria or hematuria.  Reports right lower quadrant pain as intermittent but sharp radiating 6/10.  Denies fever, nausea, vomiting or diarrhea.  Denies chest pain or shortness for breath.  Patient reports a history of chronic back pain.    Review of Systems:  Positive ROS discussed in HPI, otherwise all other systems negative.      Medications:   aspirin (ECOTRIN) 81 mg Oral Tablet, Delayed Release (E.C.), Take 1 Tablet (81 mg total) by mouth Once a day  cholecalciferol, vitamin D3, 25 mcg (1,000 unit) Oral Tablet, Take 1 Tablet (1,000 Units total) by mouth Once a day  collagen/biotin/ascorbic acid (COLLAGEN 1500 PLUS C ORAL), Take 1,000 mg by mouth  cyanocobalamin (VITAMIN B 12) 1,000 mcg Oral Tablet, Take 1 Tablet (1,000 mcg total) by mouth Once a day  ferrous sulfate (FERATAB) 324 mg (65 mg iron) Oral Tablet, Delayed Release (E.C.), TAKE 1 TABLET (324 MG TOTAL) BY MOUTH TWICE A DAY BEFORE MEALS  losartan (COZAAR) 25 mg Oral Tablet, Take 1 Tablet (25 mg total) by mouth Once a day for blood pressure  magnesium chloride (SLOW-MAG) 64 mg Oral Tablet, Delayed Release (E.C.), Take 1 Tablet (64 mg total) by mouth Once a day  metFORMIN (GLUCOPHAGE) 500 mg Oral Tablet, Take 2 Tablets (1,000 mg total) by mouth Twice  daily  multivit-min-mfolate-K-herb289 (ALIVE WOMEN'S 50 PLUS ULTRA) 800 mcg DFE- 150 mcg Oral Tablet, Take 1 Tablet by mouth Once a day  nystatin (MYCOSTATIN) 100,000 unit/gram Cream, APPLY TO THE AFFECTED AREA(S) BY TOPICAL ROUTE 2 TIMES PER DAY  nystatin (NYSTOP) 100,000 unit/gram Powder, APPLY TO THE AFFECTED AREA(S) BY TOPICAL ROUTE 2 TIMES PER DAY  omega-3 fatty acid (LOVAZA) 1 gram Oral Capsule, Take 2 Capsules (2 g total) by mouth Twice daily  omeprazole (PRILOSEC) 20 mg Oral Capsule, Delayed Release(E.C.), Take 1 Capsule (20 mg total) by mouth Once a day  pioglitazone (ACTOS) 30 mg Oral Tablet, Take 1 Tablet (30 mg total) by mouth Once a day for 90 days  rosuvastatin (CRESTOR) 20 mg Oral Tablet, Take 1 Tablet (20 mg total) by mouth Once a day    No facility-administered medications prior to visit.      Allergies:   No Known Allergies      OBJECTIVE  There were no vitals taken for this visit.  Back    Physical Exam  Constitutional:       Appearance: She is obese.   Cardiovascular:      Rate and Rhythm: Normal rate and regular rhythm.      Pulses: Normal pulses.      Heart sounds: Normal heart sounds.   Pulmonary:  Effort: Pulmonary effort is normal.      Breath sounds: Normal breath sounds.   Abdominal:      General: Bowel sounds are normal. There is no distension.      Palpations: There is no mass.      Tenderness: There is abdominal tenderness. There is no right CVA tenderness, left CVA tenderness, guarding or rebound.      Hernia: No hernia is present.   Musculoskeletal:      Thoracic back: Decreased range of motion.      Lumbar back: No tenderness. Decreased range of motion.   Skin:     General: Skin is warm.      Capillary Refill: Capillary refill takes less than 2 seconds.   Neurological:      General: No focal deficit present.      Mental Status: She is alert and oriented to person, place, and time.      Cranial Nerves: Cranial nerves 2-12 are intact.      Coordination: Coordination abnormal.    Psychiatric:         Mood and Affect: Mood normal.         Behavior: Behavior normal. Behavior is cooperative.         Cognition and Memory: Cognition normal.           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  No right lower quadrant abnormality was noted.      CT of abdomen and pelvis indicates: 1. Fatty liver, small benign cysts of the left lobe of the liver.  2.  Bilateral nonobstructing renal calculi measuring up to 12 mm in size in the left kidney.  Benign cyst in the lower pole of left kidney.  3.  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  Significant multilevel degenerative disc changes of upper and mid lumbar disc are noted.  Significant compromise the spinal canal by osteophyte complex at L1-2 and L 2-3.                  Musculoskeletal    Multilevel degenerative disc disease - Primary     Discussed x-ray dated 10/03/2022 indicated a 1.7 left lower pole renal calculus with severe degenerative changes in the lumbar spine.       CT of abdomen and pelvis indicates: 1. Fatty liver, small benign cysts of the left lobe of the liver.  2.  Bilateral nonobstructing renal calculi measuring up to 12 mm in size in the left kidney.  Benign cyst in the lower pole of left kidney.  3.  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  Significant multilevel degenerative disc changes of upper and mid lumbar disc are noted.  Significant compromise the spinal canal by osteophyte complex  at L1-2 and L 2-3.         Refer to [facility] [place]  [phone]  [address]  [place], [place]  [address]  [phone]         Relevant Orders    Referral to External Provider    Disc-osteophyte complex     Discussed x-ray dated 10/03/2022 indicated a 1.7 left lower pole renal calculus with severe degenerative changes in the lumbar spine.       CT of abdomen and pelvis indicates: 1. Fatty liver, small benign cysts of the left lobe of the liver.  2.  Bilateral nonobstructing renal calculi measuring up to 12 mm in size in the left kidney.  Benign cyst in the lower pole of left kidney.  3.  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  Significant multilevel degenerative disc changes of upper and mid lumbar disc are noted.  Significant compromise the spinal canal by osteophyte complex at L1-2 and L 2-3.         Refer to [facility] [place]  [address]  [place], [place]  [address]  [phone]         Relevant Orders    Referral to External Provider    Osteopenia after menopause     Discussed bone density scan results.  DEXA 10/25/2022 indicates osteopenia.  Discussed risk of progressing to osteoporosis.  Advised to continue calcium 1200 mg daily, vitamin-D 4000 IU daily and continue weight-bearing exercise to keep bones drawn.  Repeat bone density scan in 2 years.            Orders Placed This Encounter    Referral to External Provider   Refer to:  [facility] [place]  [address]  [place], [place]  [address]  [phone]    Return if symptoms worsen or fail to improve, for Routine appointment scheduled 12/27/2022.    I spent 26 minutes on the phone with this patient.     Stormy Fabian, NP-C 11/14/2022, 17:08

## 2022-11-14 NOTE — Assessment & Plan Note (Signed)
Discussed x-ray dated 10/03/2022 indicated a 1.7 left lower pole renal calculus with severe degenerative changes in the lumbar spine.  No right lower quadrant abnormality was noted.      CT of abdomen and pelvis indicates: 1. Fatty liver, small benign cysts of the left lobe of the liver.  2.  Bilateral nonobstructing renal calculi measuring up to 12 mm in size in the left kidney.  Benign cyst in the lower pole of left kidney.  3.  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  Significant multilevel degenerative disc changes of upper and mid lumbar disc are noted.  Significant compromise the spinal canal by osteophyte complex at L1-2 and L 2-3.

## 2022-11-14 NOTE — Assessment & Plan Note (Addendum)
Discussed x-ray dated 10/03/2022 indicated a 1.7 left lower pole renal calculus with severe degenerative changes in the lumbar spine.       CT of abdomen and pelvis indicates: 1. Fatty liver, small benign cysts of the left lobe of the liver.  2.  Bilateral nonobstructing renal calculi measuring up to 12 mm in size in the left kidney.  Benign cyst in the lower pole of left kidney.  3.  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  Significant multilevel degenerative disc changes of upper and mid lumbar disc are noted.  Significant compromise the spinal canal by osteophyte complex at L1-2 and L 2-3.         Refer to [facility] [place]  [address]  [place], [place]  [address]  [phone]

## 2022-11-14 NOTE — Assessment & Plan Note (Addendum)
Discussed x-ray dated 10/03/2022 indicated a 1.7 left lower pole renal calculus with severe degenerative changes in the lumbar spine.       CT of abdomen and pelvis indicates: 1. Fatty liver, small benign cysts of the left lobe of the liver.  2.  Bilateral nonobstructing renal calculi measuring up to 12 mm in size in the left kidney.  Benign cyst in the lower pole of left kidney.  3.  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  Significant multilevel degenerative disc changes of upper and mid lumbar disc are noted.  Significant compromise the spinal canal by osteophyte complex at L1-2 and L 2-3.         Refer to:  [facility] [place]  [address]  [place], [place]  [address]  [phone]

## 2022-11-14 NOTE — Assessment & Plan Note (Signed)
Discussed bone density scan results.  DEXA 10/25/2022 indicates osteopenia.  Discussed risk of progressing to osteoporosis.  Advised to continue calcium 1200 mg daily, vitamin-D 4000 IU daily and continue weight-bearing exercise to keep bones drawn.  Repeat bone density scan in 2 years.

## 2022-11-15 NOTE — Addendum Note (Signed)
Addended by: Lamont Dowdy on: 11/15/2022 06:11 PM     Modules accepted: Level of Service

## 2022-12-04 ENCOUNTER — Other Ambulatory Visit (RURAL_HEALTH_CENTER): Payer: Self-pay | Admitting: Family

## 2022-12-04 DIAGNOSIS — M539 Dorsopathy, unspecified: Secondary | ICD-10-CM

## 2022-12-04 DIAGNOSIS — M2578 Osteophyte, vertebrae: Secondary | ICD-10-CM

## 2022-12-04 DIAGNOSIS — M47816 Spondylosis without myelopathy or radiculopathy, lumbar region: Secondary | ICD-10-CM | POA: Insufficient documentation

## 2022-12-04 DIAGNOSIS — M545 Low back pain, unspecified: Secondary | ICD-10-CM

## 2022-12-05 ENCOUNTER — Ambulatory Visit (RURAL_HEALTH_CENTER): Payer: Self-pay | Admitting: Family

## 2022-12-06 ENCOUNTER — Ambulatory Visit (RURAL_HEALTH_CENTER): Payer: Self-pay | Admitting: Family

## 2022-12-06 ENCOUNTER — Other Ambulatory Visit (RURAL_HEALTH_CENTER): Payer: Self-pay | Admitting: Family

## 2022-12-07 ENCOUNTER — Other Ambulatory Visit (RURAL_HEALTH_CENTER): Payer: Self-pay | Admitting: Family

## 2022-12-07 DIAGNOSIS — M545 Low back pain, unspecified: Secondary | ICD-10-CM

## 2022-12-12 HISTORY — PX: COLONOSCOPY: WVUENDOPRO10

## 2022-12-13 ENCOUNTER — Other Ambulatory Visit (RURAL_HEALTH_CENTER): Payer: Self-pay | Admitting: Family

## 2022-12-19 IMAGING — MR MRI LUMBAR SPINE WITHOUT AND WITH CONTRAST
8 series · 48 of 48 positions shown · IV contrast (gadolinium)
Comparison: None available.

﻿EXAM:  [id number]   MRI LUMBAR SPINE WITHOUT AND WITH CONTRAST
INDICATION: Back pain.
TECHNIQUE: Multiplanar multisequential MRI of the lumbar spine was performed without gadolinium contrast.

[Series 8: T2 · sagittal · 4.5mm · 0.94mm/px · 5 of 13 slices shown (1 of 3)]
[im 1/13]
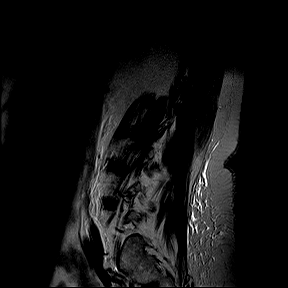
[im 4/13]
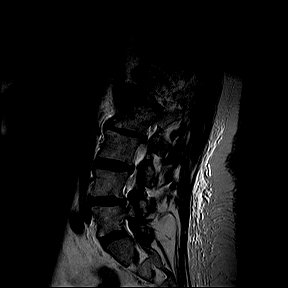
[im 7/13]
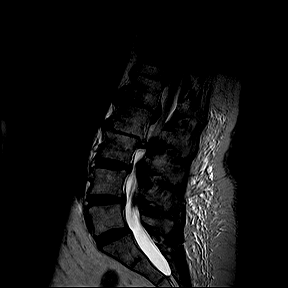
[im 10/13]
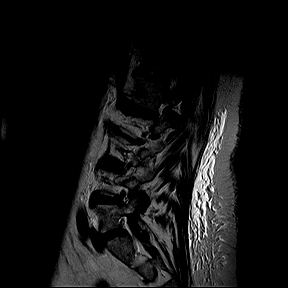
[im 13/13]
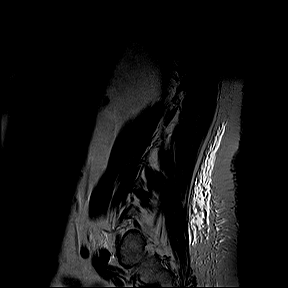

[Series 11: sag (id) · sagittal · 4.6mm · 1.05mm/px · 4 of 13 slices shown]
[im 1/13]
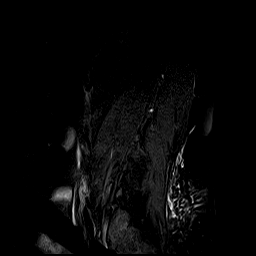
[im 5/13]
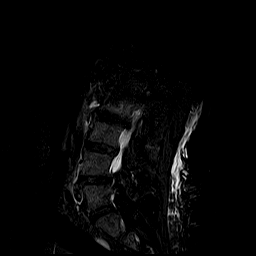
[im 9/13]
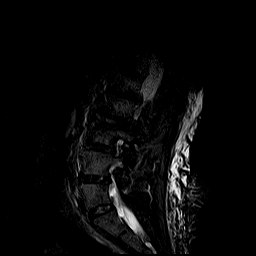
[im 13/13]
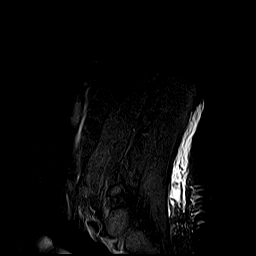

[Series 12: T1 · sagittal · 4.6mm · 0.94mm/px · 5 of 15 slices shown]
[im 1/15]
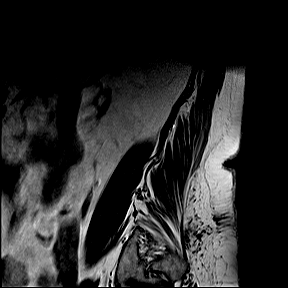
[im 4/15]
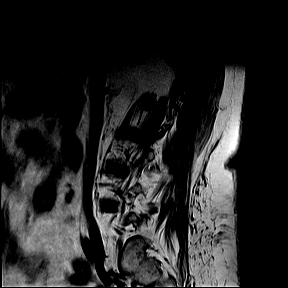
[im 8/15]
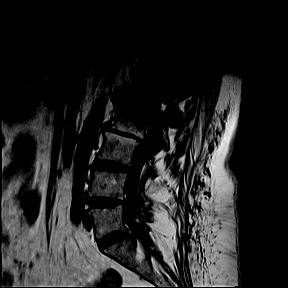
[im 11/15]
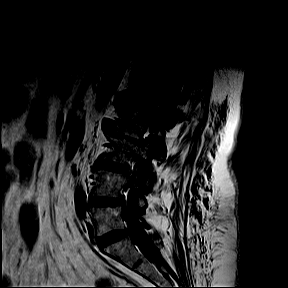
[im 15/15]
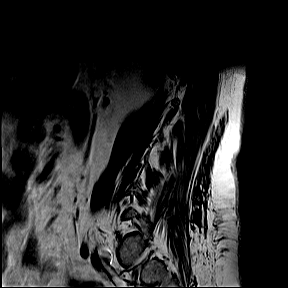

[Series 13: T2 · axial · 4.0mm · 0.47mm/px · z∈[-29,+153]mm · 7 of 23 slices shown (2 of 3)]
[im 1/23]
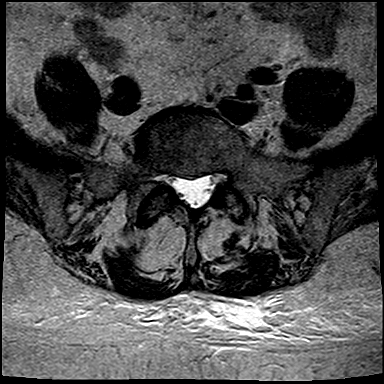
[im 4/23]
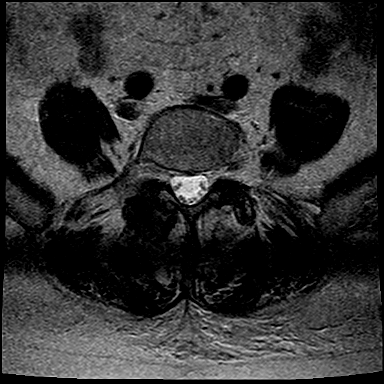
[im 8/23]
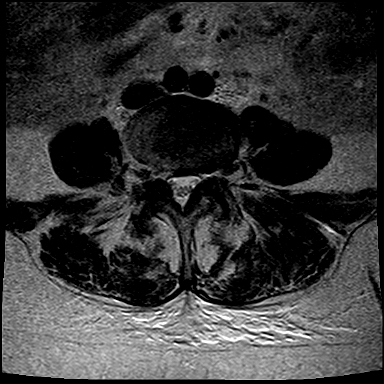
[im 12/23]
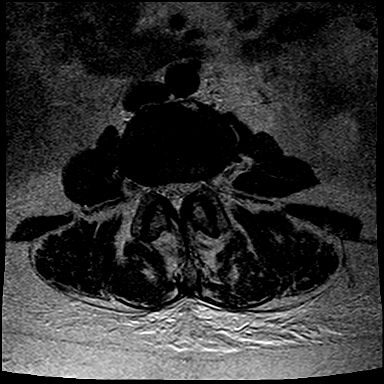
[im 15/23]
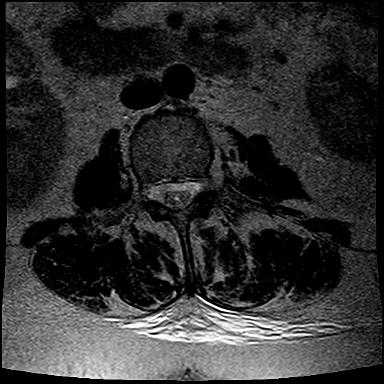
[im 19/23]
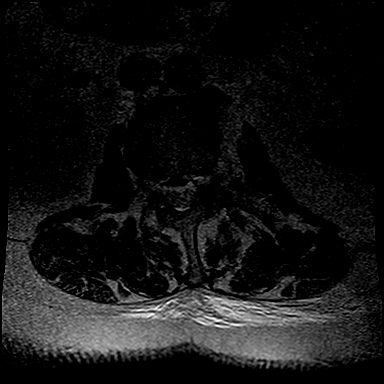
[im 23/23]
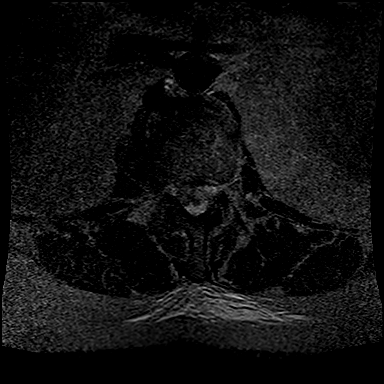

[Series 14: T1 fat-sat · axial · 4.0mm · 0.70mm/px · z∈[-29,+152]mm · 7 of 23 slices shown]
[im 1/23]
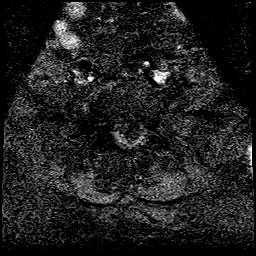
[im 4/23]
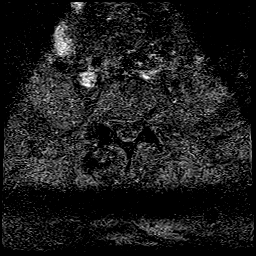
[im 8/23]
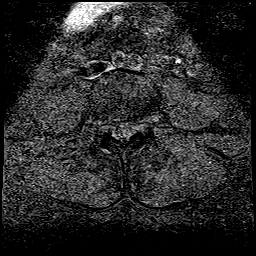
[im 12/23]
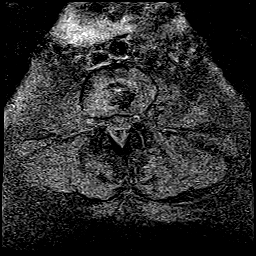
[im 15/23]
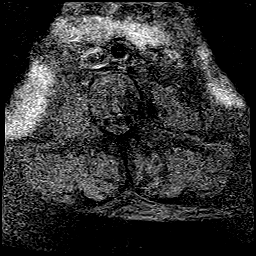
[im 19/23]
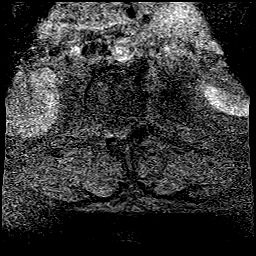
[im 23/23]
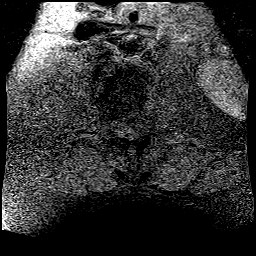

[Series 16: T2 · coronal · 5.5mm · 0.90mm/px · 8 of 24 slices shown (3 of 3)]
[im 1/24]
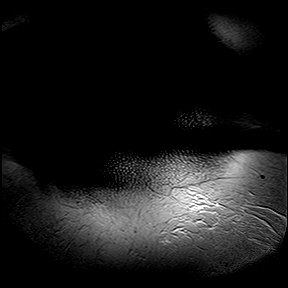
[im 4/24]
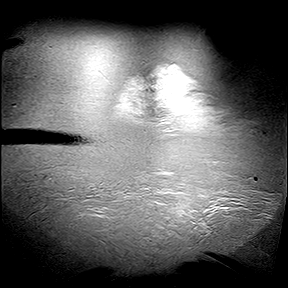
[im 7/24]
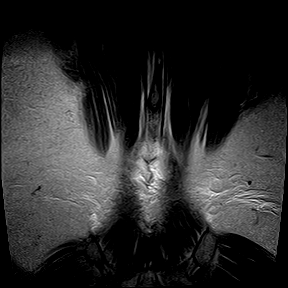
[im 10/24]
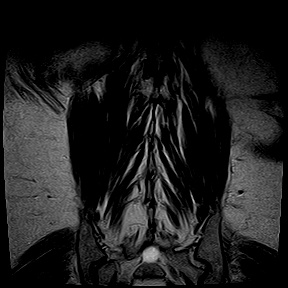
[im 14/24]
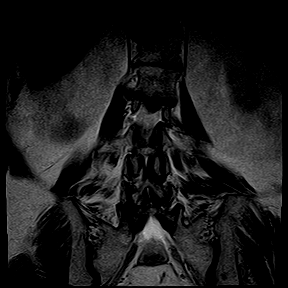
[im 17/24]
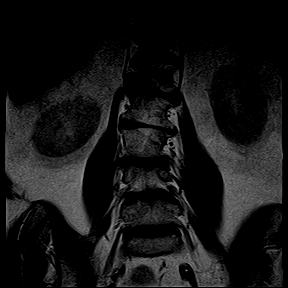
[im 20/24]
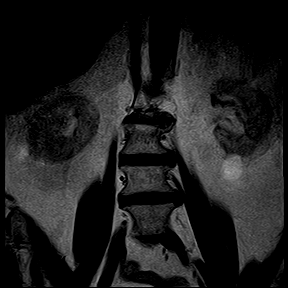
[im 24/24]
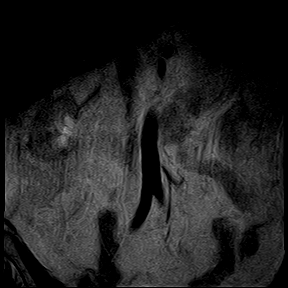

[Series 17: T1 fat-sat post-contrast · sagittal · 4.6mm · 1.05mm/px · 5 of 15 slices shown (1 of 2)]
[im 1/15]
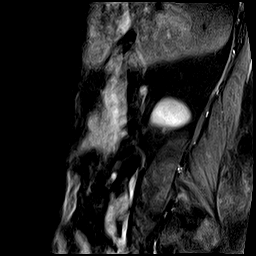
[im 4/15]
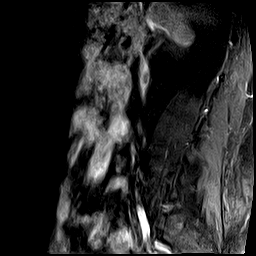
[im 8/15]
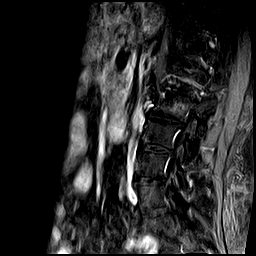
[im 11/15]
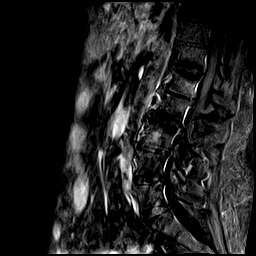
[im 15/15]
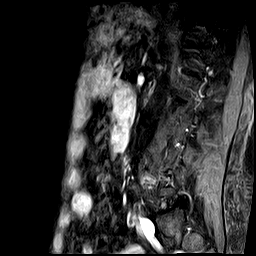

[Series 18: T1 fat-sat post-contrast · axial · 4.0mm · 0.70mm/px · z∈[-29,+152]mm · 7 of 23 slices shown (2 of 2)]
[im 1/23]
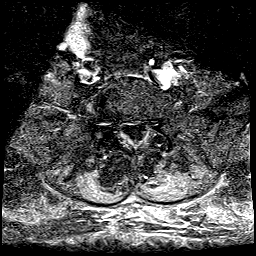
[im 4/23]
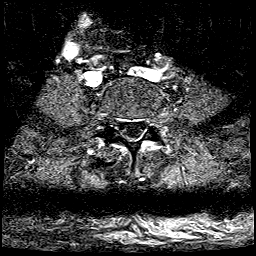
[im 8/23]
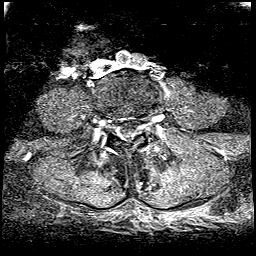
[im 12/23]
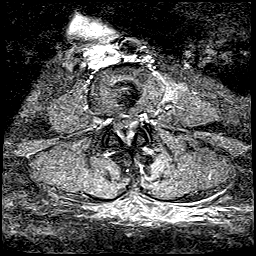
[im 15/23]
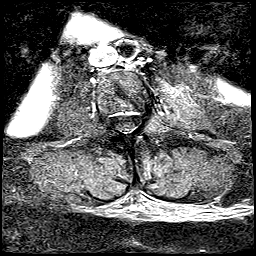
[im 19/23]
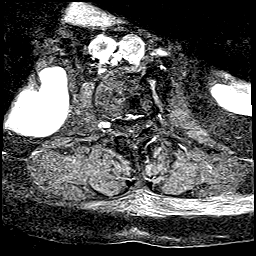
[im 23/23]
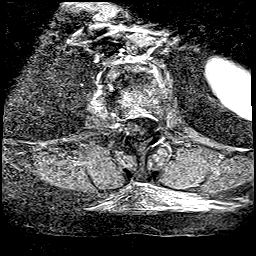

[48 of 48 positions shown; findings below may reference images not displayed]

FINDINGS: There is mild scoliosis.  There are severe degenerative changes at multiple levels with disc space narrowing, vacuum disc phenomenon, disc desiccation, osteophyte formation, and degenerative marrow signal alteration.  Mild subluxation is compatible with degenerative change.  No fracture is seen.  The conus appears unremarkable.  

This examination is limited by the patient’s large size and by motion artifact.  

At L1-2, there is a moderate sized disc herniation with extruded disc material extending cauda posterior to the L2 vertebral body.  

At L2-3, there is a small, central disc herniation.  

At L3-4, there is mild disc bulging and mild spinal stenosis.  

At L4-5, there is mild disc bulging.  

L5-S1 appears unremarkable.
IMPRESSION: 1. Limited examination.  

2. Severe degenerative changes.  

3. L1-2 and L2-3 disc herniations. 

4. Mild L3-4 spinal stenosis.

## 2022-12-26 ENCOUNTER — Other Ambulatory Visit (RURAL_HEALTH_CENTER): Payer: Self-pay | Admitting: Family

## 2022-12-27 ENCOUNTER — Other Ambulatory Visit: Payer: Self-pay

## 2022-12-27 ENCOUNTER — Ambulatory Visit (RURAL_HEALTH_CENTER): Payer: Medicare Other | Attending: Family | Admitting: Family

## 2022-12-27 ENCOUNTER — Encounter (RURAL_HEALTH_CENTER): Payer: Self-pay | Admitting: Family

## 2022-12-27 VITALS — BP 149/90 | HR 80 | Temp 96.0°F | Ht 65.0 in | Wt 254.0 lb

## 2022-12-27 DIAGNOSIS — M160 Bilateral primary osteoarthritis of hip: Secondary | ICD-10-CM

## 2022-12-27 DIAGNOSIS — E559 Vitamin D deficiency, unspecified: Secondary | ICD-10-CM | POA: Insufficient documentation

## 2022-12-27 DIAGNOSIS — E782 Mixed hyperlipidemia: Secondary | ICD-10-CM

## 2022-12-27 DIAGNOSIS — K219 Gastro-esophageal reflux disease without esophagitis: Secondary | ICD-10-CM | POA: Insufficient documentation

## 2022-12-27 DIAGNOSIS — Z7984 Long term (current) use of oral hypoglycemic drugs: Secondary | ICD-10-CM | POA: Insufficient documentation

## 2022-12-27 DIAGNOSIS — D509 Iron deficiency anemia, unspecified: Secondary | ICD-10-CM | POA: Insufficient documentation

## 2022-12-27 DIAGNOSIS — M539 Dorsopathy, unspecified: Secondary | ICD-10-CM | POA: Insufficient documentation

## 2022-12-27 DIAGNOSIS — E1142 Type 2 diabetes mellitus with diabetic polyneuropathy: Secondary | ICD-10-CM | POA: Insufficient documentation

## 2022-12-27 DIAGNOSIS — I10 Essential (primary) hypertension: Secondary | ICD-10-CM | POA: Insufficient documentation

## 2022-12-27 DIAGNOSIS — Z6841 Body Mass Index (BMI) 40.0 and over, adult: Secondary | ICD-10-CM | POA: Insufficient documentation

## 2022-12-27 DIAGNOSIS — E538 Deficiency of other specified B group vitamins: Secondary | ICD-10-CM | POA: Insufficient documentation

## 2022-12-27 MED ORDER — LOSARTAN 25 MG TABLET
25.0000 mg | ORAL_TABLET | Freq: Every day | ORAL | 1 refills | Status: DC
Start: 2022-12-27 — End: 2023-12-03

## 2022-12-27 MED ORDER — PIOGLITAZONE 30 MG TABLET
30.0000 mg | ORAL_TABLET | Freq: Every day | ORAL | 0 refills | Status: DC
Start: 2022-12-27 — End: 2023-04-08

## 2022-12-27 MED ORDER — ROSUVASTATIN 20 MG TABLET
20.0000 mg | ORAL_TABLET | Freq: Every day | ORAL | 1 refills | Status: DC
Start: 2022-12-27 — End: 2023-12-03

## 2022-12-27 MED ORDER — OMEGA-3 ACID ETHYL ESTERS 1 GRAM CAPSULE
2.0000 g | ORAL_CAPSULE | Freq: Two times a day (BID) | ORAL | 1 refills | Status: DC
Start: 2022-12-27 — End: 2024-02-28

## 2022-12-27 MED ORDER — METFORMIN 500 MG TABLET
1000.0000 mg | ORAL_TABLET | Freq: Two times a day (BID) | ORAL | 1 refills | Status: DC
Start: 2022-12-27 — End: 2023-12-27

## 2022-12-27 NOTE — Assessment & Plan Note (Signed)
Continue vitamin-D 4000 IU daily

## 2022-12-27 NOTE — Assessment & Plan Note (Signed)
Continue ferrous sulfate 325mg BID.

## 2022-12-27 NOTE — Assessment & Plan Note (Signed)
Managed by Dr. Concha Se.  Patient reports she is scheduled to have an EEG and a colonoscopy in the upcoming weeks.  Continue current medications prescribed by Gastroenterology.

## 2022-12-27 NOTE — Assessment & Plan Note (Signed)
Discussed MRI results.  MRI faxed to [facility] on 12/26/2022      Refer to:  [facility] [place]  [address]  [place], [place]  [address]  [phone]

## 2022-12-27 NOTE — Progress Notes (Signed)
FAMILY MEDICINE, [facility] FAM[facility] MEDICINE [facility]  [address]  [place] [address]  Operated by [facility]     Name: Amber Owens MRN:  [id number]   Date of Birth: [birth year] Age: 67 y.o.   Date: 12/27/2022  Time: 19:58     Provider: Stormy Fabian, NP-C    Reason for visit: Follow Up 3 Months (Follow up)      History of Present Illness:  Amber Owens is a 67 y.o. female presenting with chronic disease management.  No concerns voiced today.      Patient Active Problem List    Diagnosis Date Noted    Lumbar facet arthropathy 12/04/2022    Multilevel degenerative disc disease 11/14/2022    Disc-osteophyte complex 11/14/2022    Osteopenia after menopause 11/14/2022      DEXA 10/25/2022 indicates osteopenia.       Fatty liver 11/14/2022     Dr. Concha Se.      Need for shingles vaccine 10/12/2022    Postmenopausal status 10/12/2022    Gait abnormality 10/12/2022    Risk for falls 10/12/2022    Colon cancer high risk 10/12/2022    Low back pain 08/16/2022    Iron deficiency anemia 06/19/2022    Hypomagnesemia 06/19/2022     Rx:  Magnesium 64 mg daily.      Osteoarthritis (arthritis due to wear and tear of joints) 04/03/2022    Impaired functional mobility, balance, gait, and endurance 03/16/2022     Ambulatory without assistance.      Morbid obesity (CMS HCC) 12/12/2021    B12 deficiency 11/08/2021    Hypertensive disorder 08/01/2021     Dr. Renda Rolls.      Mixed hyperlipidemia 08/01/2021    Gastroesophageal reflux disease without esophagitis 08/01/2021    Environmental and seasonal allergies 08/01/2021     Advised and over-the-counter antihistamine.  Patient declined respiratory screenings.       Colon cancer screening 08/01/2021    Vitamin D deficiency 08/01/2021     Vitamin-4000 IU daily and OTC calcium 1200 mg daily.      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  Last eye exam 12/18/20, Dr. Micheline Rough.         Historical Data    Past Medical  History:  Past Medical History:   Diagnosis Date    Abdominal pain     Acute cystitis with hematuria 10/03/2022    Anxiety     Anxiety     B12 deficiency     Chronic back pain     Chronic constipation     Chronic left hip pain     Chronic right lower quadrant pain 10/03/2022    COVID     Cystocele with rectocele     Diabetes mellitus, type 2 (CMS HCC)     Diabetic neuropathy, type II diabetes mellitus (CMS HCC)     Encounter for screening for malignant neoplasm of breast     Esophageal reflux     Female bladder prolapse     Hypercholesterolemia     Hypertension     Intermittent claudication (CMS HCC)     Leukocytes in urine 08/16/2022    Morbid obesity with BMI of 40.0-44.9, adult (CMS HCC)     OAB (overactive bladder)     Osteoarthritis of spinal facet joint     Steatosis of liver     Vaginal vault prolapse  Vitamin D deficiency      Past Surgical History:  Past Surgical History:   Procedure Laterality Date    BLADDER SURGERY  11/2019    prolapse bladder    COLONOSCOPY      HX APPENDECTOMY      HX HYSTERECTOMY  1995    HX TUBAL LIGATION Bilateral 1993    REPAIR RECTOCELE  2023    Dr. Luella Cook     Allergies:  No Known Allergies  Medications:  Current Outpatient Medications   Medication Sig    aspirin (ECOTRIN) 81 mg Oral Tablet, Delayed Release (E.C.) Take 1 Tablet (81 mg total) by mouth Once a day    calcium citrate-vitamin D3 (CITRACAL) 200 mg-6.25 mcg (250 unit) Oral Tablet Take by mouth Once a day    cholecalciferol, vitamin D3, 25 mcg (1,000 unit) Oral Tablet Take 1 Tablet (1,000 Units total) by mouth Once a day    collagen/biotin/ascorbic acid (COLLAGEN 1500 PLUS C ORAL) Take 1,000 mg by mouth    cyanocobalamin (VITAMIN B 12) 1,000 mcg Oral Tablet Take 1 Tablet (1,000 mcg total) by mouth Once a day    doxycycline hyclate (VIBRAMYCIN) 100 mg Oral Capsule     ferrous sulfate (FERATAB) 324 mg (65 mg iron) Oral Tablet, Delayed Release (E.C.) TAKE 1 TABLET (324 MG TOTAL) BY MOUTH TWICE A DAY BEFORE MEALS     losartan (COZAAR) 25 mg Oral Tablet Take 1 Tablet (25 mg total) by mouth Once a day for blood pressure    magnesium chloride (SLOW-MAG) 64 mg Oral Tablet, Delayed Release (E.C.) Take 1 Tablet (64 mg total) by mouth Once a day    metFORMIN (GLUCOPHAGE) 500 mg Oral Tablet Take 2 Tablets (1,000 mg total) by mouth Twice daily    metroNIDAZOLE (FLAGYL) 500 mg Oral Tablet     multivit-min-mfolate-K-herb289 (ALIVE WOMEN'S 50 PLUS ULTRA) 800 mcg DFE- 150 mcg Oral Tablet Take 1 Tablet by mouth Once a day    nystatin (MYCOSTATIN) 100,000 unit/gram Cream APPLY TO THE AFFECTED AREA(S) BY TOPICAL ROUTE 2 TIMES PER DAY    nystatin (NYSTOP) 100,000 unit/gram Powder APPLY TO THE AFFECTED AREA(S) BY TOPICAL ROUTE 2 TIMES PER DAY    omega-3 fatty acid (LOVAZA) 1 gram Oral Capsule Take 2 Capsules (2 g total) by mouth Twice daily    omeprazole (PRILOSEC) 20 mg Oral Capsule, Delayed Release(E.C.) Take 1 Capsule (20 mg total) by mouth Once a day (Patient taking differently: Take 2 Capsules (40 mg total) by mouth Once a day)    pioglitazone (ACTOS) 30 mg Oral Tablet Take 1 Tablet (30 mg total) by mouth Once a day for 90 days    rosuvastatin (CRESTOR) 20 mg Oral Tablet Take 1 Tablet (20 mg total) by mouth Once a day     Family History:  Family Medical History:       Problem Relation (Age of Onset)    Breast Cancer Maternal Aunt    No Known Problems Father, Sister, Brother, Maternal Grandmother, Maternal Grandfather, Paternal Grandmother, Paternal Grandfather, Daughter, Son, Maternal Uncle, Paternal Aunt, Paternal Uncle, Other    Stomach Cancer Mother            Social History:  Social History     Socioeconomic History    Marital status: Married     Spouse name: Elivia Abrahams    Number of children: 1    Highest education level: 12th grade   Tobacco Use    Smoking status: Never    Smokeless  tobacco: Never   Substance and Sexual Activity    Alcohol use: Never    Drug use: Never    Sexual activity: Not Currently     Partners: Male     Social  Determinants of Health     Financial Resource Strain: Low Risk  (11/14/2022)    Financial Resource Strain     SDOH Financial: No   Transportation Needs: Low Risk  (11/14/2022)    Transportation Needs     SDOH Transportation: No   Social Connections: Low Risk  (11/14/2022)    Social Connections     SDOH Social Isolation: 5 or more times a week   Intimate Partner Violence: Low Risk  (11/14/2022)    Intimate Partner Violence     SDOH Domestic Violence: No   Recent Concern: Intimate Partner Violence - High Risk (08/16/2022)    Intimate Partner Violence     SDOH Domestic Violence: No   Housing Stability: Low Risk  (11/14/2022)    Housing Stability     SDOH Housing Situation: I have housing.     SDOH Housing Worry: No           Review of Systems:  Any pertinent Review of Systems as addressed in the HPI above.    Physical Exam:  Vital Signs:  Vitals:    12/27/22 1417   BP: (!) 149/90   Pulse: 80   Temp: (!) 35.6 C (96 F)   Weight: 115 kg (254 lb)   Height: 1.651 m (5\' 5" )   BMI: 42.36     Physical Exam  Vitals reviewed.   Constitutional:       Appearance: She is obese.   Cardiovascular:      Rate and Rhythm: Normal rate and regular rhythm.      Pulses: Normal pulses.      Heart sounds: Normal heart sounds, S1 normal and S2 normal.   Pulmonary:      Effort: Pulmonary effort is normal.      Breath sounds: Normal breath sounds.   Abdominal:      General: Bowel sounds are normal.      Palpations: Abdomen is soft.   Musculoskeletal:      Cervical back: Neck supple.      Right lower leg: No edema.      Left lower leg: No edema.   Skin:     General: Skin is warm.   Neurological:      General: No focal deficit present.      Mental Status: She is alert and oriented to person, place, and time.      Cranial Nerves: Cranial nerves 2-12 are intact.      Motor: Motor function is intact.      Coordination: Coordination is intact.      Gait: Gait normal.   Psychiatric:         Mood and Affect: Mood normal.         Behavior: Behavior normal.  Behavior is cooperative.         Thought Content: Thought content normal.         Cognition and Memory: Cognition normal.         Judgment: Judgment normal.          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)  (primary encounter diagnosis)    (I10) Hypertensive disorder    (E78.2) Mixed hyperlipidemia    (K21.9) Gastroesophageal reflux disease without esophagitis    (  E53.8) B12 deficiency    (E55.9) Vitamin D deficiency    (D50.9) Iron deficiency anemia    (E66.01) Morbid obesity (CMS HCC)    (M16.0) Osteoarthritis of both hips, unspecified osteoarthritis type    (M53.9) Multilevel degenerative disc disease             Problem List Items Addressed This Visit          Cardiovascular System    Hypertensive disorder     BP elevated today, patient will monitor BP daily and RTC in 2 weeks to recheck BP , if BP is > 130/80.  Patient reports BP is normal at cardiology appt. Continue current medications.  Avoid OTC NSAID'S.  Limit sodium in diet.         Mixed hyperlipidemia     Continue Lovaza and Crestor 20 mg daily.  Advised to limit high fat foods and processed foods in diet.            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  Continue current medications.  Advised a diabetic diet and daily physical exercise.            Digestive    Gastroesophageal reflux disease without esophagitis     Managed by Dr. Concha Se.  Patient reports she is scheduled to have an EEG and a colonoscopy in the upcoming weeks.  Continue current medications prescribed by Gastroenterology.            Endocrine    Vitamin D deficiency     Continue vitamin-D 4000 IU daily.         B12 deficiency     Continue vitamin B12 1000 mcg daily.            Hematologic/Lymphatic    Iron deficiency anemia     Continue ferrous sulfate 325mg  BID.              Musculoskeletal    Osteoarthritis (arthritis due to wear and tear of joints)     Declined PT.  Advised  over-the-counter Tylenol Arthritis as needed.         Multilevel degenerative disc disease     Discussed MRI results.  MRI faxed to [facility] on 12/26/2022      Refer to:  [facility]  [address]  [place], [place]  [address]  [phone]            Other    Morbid obesity (CMS [facility])     Discussed diet and exercise.              Discussed labs s today.  Continue current medications as prescribed.  Advised a low-fat/low sodium diet, advised 150 minutes of scheduled weekly physical activity as tolerated.  Advised to maintain a healthy weight.     Return in about 3 weeks (around 01/17/2023) for 3 week for UTI follow up; 3 months routine visit.    Stormy Fabian, NP-C     Portions of this note may be dictated using voice recognition software or a dictation service. Variances in spelling and vocabulary are possible and unintentional. Not all errors are caught/corrected. Please notify the [facility] Parkin if any discrepancies are noted or if the meaning of any statement is not clear.

## 2022-12-27 NOTE — Assessment & Plan Note (Signed)
BP elevated today, patient will monitor BP daily and RTC in 2 weeks to recheck BP , if BP is > 130/80.  Patient reports BP is normal at cardiology appt. Continue current medications.  Avoid OTC NSAID'S.  Limit sodium in diet.

## 2022-12-27 NOTE — Assessment & Plan Note (Signed)
A1c 6.3 .  Continue current medications.  Advised a diabetic diet and daily physical exercise.

## 2022-12-27 NOTE — Assessment & Plan Note (Signed)
Discussed diet and exercise 

## 2022-12-27 NOTE — Assessment & Plan Note (Signed)
Continue vitamin B12 1000 mcg daily.

## 2023-01-01 NOTE — Assessment & Plan Note (Signed)
Declined PT.  Advised over-the-counter Tylenol Arthritis as needed.

## 2023-01-01 NOTE — Assessment & Plan Note (Signed)
Continue Lovaza and Crestor 20 mg daily.  Advised to limit high fat foods and processed foods in diet.Marland Kitchen

## 2023-01-05 DIAGNOSIS — M1711 Unilateral primary osteoarthritis, right knee: Secondary | ICD-10-CM | POA: Insufficient documentation

## 2023-01-09 DIAGNOSIS — M5416 Radiculopathy, lumbar region: Secondary | ICD-10-CM | POA: Insufficient documentation

## 2023-01-17 ENCOUNTER — Ambulatory Visit (RURAL_HEALTH_CENTER): Payer: Medicare Other | Attending: Family | Admitting: Family

## 2023-01-17 ENCOUNTER — Other Ambulatory Visit: Payer: Self-pay

## 2023-01-17 ENCOUNTER — Encounter (RURAL_HEALTH_CENTER): Payer: Self-pay | Admitting: Family

## 2023-01-17 VITALS — BP 134/69 | HR 73 | Temp 98.0°F | Resp 18 | Ht 65.0 in | Wt 251.2 lb

## 2023-01-17 DIAGNOSIS — R300 Dysuria: Secondary | ICD-10-CM | POA: Insufficient documentation

## 2023-01-17 LAB — POCT URINE DIPSTICK
BILIRUBIN: NEGATIVE
GLUCOSE: NEGATIVE
LEUKOCYTES: NEGATIVE
NITRITE: NEGATIVE
PH: 6
PROTEIN: 30
SPECIFIC GRAVITY: 1.02
UROBILINOGEN: 1

## 2023-01-17 MED ORDER — CONJUGATED ESTROGENS 0.625 MG/GRAM VAGINAL CREAM
0.5000 g | TOPICAL_CREAM | Freq: Every evening | VAGINAL | 2 refills | Status: AC
Start: 2023-01-17 — End: 2023-04-17

## 2023-01-17 NOTE — Nursing Note (Signed)
Patient here today with follow-up on UTI.

## 2023-01-17 NOTE — Progress Notes (Signed)
FAMILY MEDICINE, [facility] FAMILY MEDICINE [facility]  [address]  [place] [address]  Operated by [facility]     Name: Amber Owens MRN:  [id number]   Date of Birth: April 06, 1956 Age: 67 y.o.   Date: 01/17/2023  Time: 19:04     Provider: Asa Lente, FNP    Reason for visit: Follow Up (Follow-up on urine culture. )      History of Present Illness:  Amber Owens is a 67 y.o. female presents to the clinic to give a urine sample.   She tells me that she started ABX on July 28th for a UTI and Bacteria Vaginosis. She tells me she finished the Abx Sept 7th.   She had repetitive UTI for the last 3 months.   She was last treated with Doxycycline and metronidazole.     She tells me that she does feel like she may be starting with another UTI.   She having soreness in her genital area and pressure.     She has seen a urologist in the past and they suggested vaginal estrogen,but she was hesitant use it because her sister had breast cancer.  She has had a partial hysterectomy and she is not sexually active.     Patient Active Problem List    Diagnosis Date Noted    Lumbar facet arthropathy 12/04/2022    Multilevel degenerative disc disease 11/14/2022    Disc-osteophyte complex 11/14/2022    Osteopenia after menopause 11/14/2022      DEXA 10/25/2022 indicates osteopenia.       Fatty liver 11/14/2022     Dr. Concha Se.      Need for shingles vaccine 10/12/2022    Postmenopausal status 10/12/2022    Gait abnormality 10/12/2022    Risk for falls 10/12/2022    Colon cancer high risk 10/12/2022    Low back pain 08/16/2022    Iron deficiency anemia 06/19/2022    Hypomagnesemia 06/19/2022     Rx:  Magnesium 64 mg daily.      Osteoarthritis (arthritis due to wear and tear of joints) 04/03/2022    Impaired functional mobility, balance, gait, and endurance 03/16/2022     Ambulatory without assistance.      Morbid obesity (CMS HCC) 12/12/2021    B12 deficiency 11/08/2021    Hypertensive disorder  08/01/2021     Dr. Renda Rolls.      Mixed hyperlipidemia 08/01/2021    Gastroesophageal reflux disease without esophagitis 08/01/2021    Environmental and seasonal allergies 08/01/2021     Advised and over-the-counter antihistamine.  Patient declined respiratory screenings.       Colon cancer screening 08/01/2021    Vitamin D deficiency 08/01/2021     Vitamin-4000 IU daily and OTC calcium 1200 mg daily.      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  Last eye exam 12/18/20, Dr. Micheline Rough.         Historical Data    Past Medical History:  Past Medical History:   Diagnosis Date    Abdominal pain     Acute cystitis with hematuria 10/03/2022    Anxiety     Anxiety     B12 deficiency     Chronic back pain     Chronic constipation     Chronic left hip pain     Chronic right lower quadrant pain 10/03/2022    COVID  Cystocele with rectocele     Diabetes mellitus, type 2 (CMS HCC)     Diabetic neuropathy, type II diabetes mellitus (CMS HCC)     Encounter for screening for malignant neoplasm of breast     Esophageal reflux     Female bladder prolapse     Hypercholesterolemia     Hypertension     Intermittent claudication (CMS HCC)     Leukocytes in urine 08/16/2022    Morbid obesity with BMI of 40.0-44.9, adult (CMS HCC)     OAB (overactive bladder)     Osteoarthritis of spinal facet joint     Steatosis of liver     Vaginal vault prolapse     Vitamin D deficiency      Past Surgical History:  Past Surgical History:   Procedure Laterality Date    BLADDER SURGERY  11/2019    prolapse bladder    COLONOSCOPY      HX APPENDECTOMY      HX HYSTERECTOMY  1995    HX TUBAL LIGATION Bilateral 1993    REPAIR RECTOCELE  2023    Dr. Luella Cook     Allergies:  No Known Allergies  Medications:  Current Outpatient Medications   Medication Sig    aspirin (ECOTRIN) 81 mg Oral Tablet, Delayed Release (E.C.) Take 1 Tablet (81 mg total) by mouth Once a day    calcium citrate-vitamin D3 (CITRACAL) 200 mg-6.25 mcg (250  unit) Oral Tablet Take by mouth Once a day    cholecalciferol, vitamin D3, 25 mcg (1,000 unit) Oral Tablet Take 1 Tablet (1,000 Units total) by mouth Once a day    collagen/biotin/ascorbic acid (COLLAGEN 1500 PLUS C ORAL) Take 1,000 mg by mouth    conjugated estrogens (PREMARIN) 0.625 mg/gram Vaginal Cream Insert 0.5 g into the vagina Every night for 90 days Insert 0.5g into vagina nightly for 21 days, followed by no treatment for 7 days. Then repeat.    cyanocobalamin (VITAMIN B 12) 1,000 mcg Oral Tablet Take 1 Tablet (1,000 mcg total) by mouth Once a day    doxycycline hyclate (VIBRAMYCIN) 100 mg Oral Capsule     ferrous sulfate (FERATAB) 324 mg (65 mg iron) Oral Tablet, Delayed Release (E.C.) TAKE 1 TABLET (324 MG TOTAL) BY MOUTH TWICE A DAY BEFORE MEALS    losartan (COZAAR) 25 mg Oral Tablet Take 1 Tablet (25 mg total) by mouth Once a day for blood pressure    magnesium chloride (SLOW-MAG) 64 mg Oral Tablet, Delayed Release (E.C.) Take 1 Tablet (64 mg total) by mouth Once a day    metFORMIN (GLUCOPHAGE) 500 mg Oral Tablet Take 2 Tablets (1,000 mg total) by mouth Twice daily    metroNIDAZOLE (FLAGYL) 500 mg Oral Tablet     multivit-min-mfolate-K-herb289 (ALIVE WOMEN'S 50 PLUS ULTRA) 800 mcg DFE- 150 mcg Oral Tablet Take 1 Tablet by mouth Once a day    nystatin (MYCOSTATIN) 100,000 unit/gram Cream APPLY TO THE AFFECTED AREA(S) BY TOPICAL ROUTE 2 TIMES PER DAY    nystatin (NYSTOP) 100,000 unit/gram Powder APPLY TO THE AFFECTED AREA(S) BY TOPICAL ROUTE 2 TIMES PER DAY    omega-3 fatty acid (LOVAZA) 1 gram Oral Capsule Take 2 Capsules (2 g total) by mouth Twice daily    omeprazole (PRILOSEC) 20 mg Oral Capsule, Delayed Release(E.C.) Take 1 Capsule (20 mg total) by mouth Once a day (Patient taking differently: Take 2 Capsules (40 mg total) by mouth Once a day)    pioglitazone (ACTOS) 30 mg Oral  Tablet Take 1 Tablet (30 mg total) by mouth Once a day for 90 days    rosuvastatin (CRESTOR) 20 mg Oral Tablet Take 1 Tablet  (20 mg total) by mouth Once a day     Family History:  Family Medical History:       Problem Relation (Age of Onset)    Breast Cancer Maternal Aunt    No Known Problems Father, Sister, Brother, Maternal Grandmother, Maternal Grandfather, Paternal Grandmother, Paternal Grandfather, Daughter, Son, Maternal Uncle, Paternal Aunt, Paternal Uncle, Other    Stomach Cancer Mother            Social History:  Social History     Socioeconomic History    Marital status: Married     Spouse name: Nariya Wandrey    Number of children: 1    Highest education level: 12th grade   Tobacco Use    Smoking status: Never    Smokeless tobacco: Never   Substance and Sexual Activity    Alcohol use: Never    Drug use: Never    Sexual activity: Not Currently     Partners: Male     Social Determinants of Health     Financial Resource Strain: Low Risk  (11/14/2022)    Financial Resource Strain     SDOH Financial: No   Transportation Needs: Low Risk  (11/14/2022)    Transportation Needs     SDOH Transportation: No   Social Connections: Low Risk  (11/14/2022)    Social Connections     SDOH Social Isolation: 5 or more times a week   Intimate Partner Violence: Low Risk  (11/14/2022)    Intimate Partner Violence     SDOH Domestic Violence: No   Recent Concern: Intimate Partner Violence - High Risk (08/16/2022)    Intimate Partner Violence     SDOH Domestic Violence: No   Housing Stability: Low Risk  (11/14/2022)    Housing Stability     SDOH Housing Situation: I have housing.     SDOH Housing Worry: No           Review of Systems:  Any pertinent Review of Systems as addressed in the HPI above.    Physical Exam:  Vital Signs:  Vitals:    01/17/23 1507   BP: 134/69   Pulse: 73   Resp: 18   Temp: 36.7 C (98 F)   TempSrc: Temporal   SpO2: 96%   Weight: 114 kg (251 lb 4 oz)   Height: 1.651 m (5\' 5" )   BMI: 41.9     Physical Exam  Vitals reviewed.   Constitutional:       Appearance: Normal appearance.   HENT:      Head: Normocephalic and atraumatic.   Eyes:       General: No scleral icterus.     Conjunctiva/sclera: Conjunctivae normal.   Cardiovascular:      Rate and Rhythm: Normal rate.   Pulmonary:      Effort: Pulmonary effort is normal.   Abdominal:      Palpations: Abdomen is soft.      Tenderness: There is no abdominal tenderness.   Skin:     Coloration: Skin is not cyanotic or pale.   Neurological:      Mental Status: She is alert and oriented to person, place, and time. Mental status is at baseline.          Assessment/Plan:  (R30.0) Dysuria  (primary encounter diagnosis)  Plan: POCT Urine  Dipstick, URINE CULTURE    Lots of education given on postmenopausal UTI and the safety of vaginal estrogen.   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  UA was negative for leuk or nitrates, but positive for trace bld.   Will send for culture, and she has agree to consider using vaginal estrogen.     Orders Placed This Encounter    URINE CULTURE    POCT Urine Dipstick    conjugated estrogens (PREMARIN) 0.625 mg/gram Vaginal Cream         Return if symptoms worsen or fail to improve.    Shawonda Kerce L Mirriam Vadala, FNP     Portions of this note may be dictated using voice recognition software or a dictation service. Variances in spelling and vocabulary are possible and unintentional. Not all errors are caught/corrected. Please notify the [facility] Parkin if any discrepancies are noted or if the meaning of any statement is not clear.

## 2023-01-18 ENCOUNTER — Other Ambulatory Visit (RURAL_HEALTH_CENTER): Payer: Self-pay | Admitting: Family

## 2023-01-18 NOTE — Result Encounter Note (Signed)
Jasmine,   Will you give Mrs Quist a call and tell her so far.... Her urine is negative. They will continue to monitor it for few more days, but I want to give her an update before I left on vacation.   Let her know I enjoyed meeting her  Thanks Yanky Vanderburg.

## 2023-01-19 LAB — URINE CULTURE: URINE CULTURE: 5000 — AB

## 2023-01-23 ENCOUNTER — Telehealth (RURAL_HEALTH_CENTER): Payer: Self-pay | Admitting: Family

## 2023-01-23 ENCOUNTER — Other Ambulatory Visit (RURAL_HEALTH_CENTER): Payer: Self-pay | Admitting: Family

## 2023-01-23 NOTE — Telephone Encounter (Signed)
Amber Owens said she spoke to you last week about the alternative to the estrogen cream Amy prescribed. She said the alternative was $50.00 instead of the $200.00. Can you call her and let her know Lynden Ang is out of the office today and you will check with her tomorrow about this? Thanks  [phone]

## 2023-01-24 ENCOUNTER — Other Ambulatory Visit (RURAL_HEALTH_CENTER): Payer: Self-pay | Admitting: Family

## 2023-01-24 ENCOUNTER — Ambulatory Visit (RURAL_HEALTH_CENTER): Payer: Self-pay | Admitting: Family

## 2023-01-25 ENCOUNTER — Other Ambulatory Visit (RURAL_HEALTH_CENTER): Payer: Self-pay | Admitting: Family

## 2023-01-25 MED ORDER — ESTRADIOL 0.01% (0.1 MG/GRAM) VAGINAL CREAM
1.0000 g | TOPICAL_CREAM | VAGINAL | 3 refills | Status: AC
Start: 2023-01-26 — End: 2023-02-25

## 2023-01-26 ENCOUNTER — Other Ambulatory Visit (RURAL_HEALTH_CENTER): Payer: Self-pay

## 2023-02-01 ENCOUNTER — Other Ambulatory Visit (HOSPITAL_COMMUNITY): Payer: Self-pay | Admitting: Orthopaedic Surgery

## 2023-02-01 DIAGNOSIS — M1990 Unspecified osteoarthritis, unspecified site: Secondary | ICD-10-CM

## 2023-02-03 NOTE — Result Encounter Note (Signed)
Ernest Haber, MA  Cayden Rautio, Bryson Ha, FNP  Called and informed patient urine culture. Patient states that she understands and she will let us know if she has any concerns or questions.

## 2023-02-21 ENCOUNTER — Ambulatory Visit (HOSPITAL_COMMUNITY): Payer: Self-pay

## 2023-03-05 DIAGNOSIS — E119 Type 2 diabetes mellitus without complications: Secondary | ICD-10-CM | POA: Insufficient documentation

## 2023-03-05 DIAGNOSIS — E1142 Type 2 diabetes mellitus with diabetic polyneuropathy: Secondary | ICD-10-CM | POA: Insufficient documentation

## 2023-03-19 HISTORY — PX: LAMINECTOMY AND MICRODISCECTOMY LUMBAR SPINE: SHX1913

## 2023-03-19 HISTORY — PX: HX BACK SURGERY: SHX140

## 2023-03-26 ENCOUNTER — Ambulatory Visit (RURAL_HEALTH_CENTER): Payer: Self-pay | Admitting: Family

## 2023-04-02 ENCOUNTER — Other Ambulatory Visit (RURAL_HEALTH_CENTER): Payer: Self-pay | Admitting: Family

## 2023-04-02 DIAGNOSIS — I10 Essential (primary) hypertension: Secondary | ICD-10-CM

## 2023-04-02 DIAGNOSIS — E1142 Type 2 diabetes mellitus with diabetic polyneuropathy: Secondary | ICD-10-CM

## 2023-04-02 DIAGNOSIS — K219 Gastro-esophageal reflux disease without esophagitis: Secondary | ICD-10-CM

## 2023-04-02 DIAGNOSIS — E538 Deficiency of other specified B group vitamins: Secondary | ICD-10-CM

## 2023-04-02 DIAGNOSIS — E782 Mixed hyperlipidemia: Secondary | ICD-10-CM

## 2023-04-02 DIAGNOSIS — E559 Vitamin D deficiency, unspecified: Secondary | ICD-10-CM

## 2023-04-04 ENCOUNTER — Encounter (RURAL_HEALTH_CENTER): Payer: Self-pay | Admitting: Family

## 2023-04-04 ENCOUNTER — Other Ambulatory Visit: Payer: Self-pay

## 2023-04-04 ENCOUNTER — Ambulatory Visit (RURAL_HEALTH_CENTER): Payer: Medicare Other | Attending: Family | Admitting: Family

## 2023-04-04 VITALS — BP 136/80 | HR 83 | Temp 98.4°F | Wt 236.0 lb

## 2023-04-04 DIAGNOSIS — K760 Fatty (change of) liver, not elsewhere classified: Secondary | ICD-10-CM | POA: Insufficient documentation

## 2023-04-04 DIAGNOSIS — Z7409 Other reduced mobility: Secondary | ICD-10-CM

## 2023-04-04 DIAGNOSIS — E559 Vitamin D deficiency, unspecified: Secondary | ICD-10-CM | POA: Insufficient documentation

## 2023-04-04 DIAGNOSIS — K219 Gastro-esophageal reflux disease without esophagitis: Secondary | ICD-10-CM | POA: Insufficient documentation

## 2023-04-04 DIAGNOSIS — E538 Deficiency of other specified B group vitamins: Secondary | ICD-10-CM | POA: Insufficient documentation

## 2023-04-04 DIAGNOSIS — I10 Essential (primary) hypertension: Secondary | ICD-10-CM | POA: Insufficient documentation

## 2023-04-04 DIAGNOSIS — E1142 Type 2 diabetes mellitus with diabetic polyneuropathy: Secondary | ICD-10-CM | POA: Insufficient documentation

## 2023-04-04 DIAGNOSIS — D509 Iron deficiency anemia, unspecified: Secondary | ICD-10-CM | POA: Insufficient documentation

## 2023-04-04 DIAGNOSIS — Z9181 History of falling: Secondary | ICD-10-CM

## 2023-04-04 DIAGNOSIS — Z7984 Long term (current) use of oral hypoglycemic drugs: Secondary | ICD-10-CM | POA: Insufficient documentation

## 2023-04-04 DIAGNOSIS — E782 Mixed hyperlipidemia: Secondary | ICD-10-CM | POA: Insufficient documentation

## 2023-04-04 NOTE — Progress Notes (Addendum)
FAMILY MEDICINE, [facility] FAMILY MEDICINE [facility]  [address]  [place] [address]  Operated by [facility]     Name: Amber Owens MRN:  [id number]   Date of Birth: 1955/08/26 Age: 67 y.o.   Date: 04/04/2023  Time: 15:09     Provider: Stormy Fabian, NP-C    Reason for visit: Follow Up 3 Months (3 month follow up)      History of Present Illness:  Amber Owens is a 67 y.o. female presenting with chronic disease management.      Patient reports she had back surgery 2 weeks ago at [facility].  Reports she is doing well.  Patient denies numbness or tingling in extremities.  Denies loss of control of bowel or bladder. Reports she is getting stronger every day.  Patient is in a wheelchair at home but reports she is walking more at home.       Patient Active Problem List    Diagnosis Date Noted    Lumbar facet arthropathy 12/04/2022    Multilevel degenerative disc disease 11/14/2022    Disc-osteophyte complex 11/14/2022    Osteopenia after menopause 11/14/2022      DEXA 10/25/2022 indicates osteopenia.       Fatty liver 11/14/2022     Dr. Concha Se.      Need for shingles vaccine 10/12/2022    Postmenopausal status 10/12/2022    Gait abnormality 10/12/2022    Risk for falls 10/12/2022    Colon cancer high risk 10/12/2022    Low back pain 08/16/2022    Iron deficiency anemia 06/19/2022    Hypomagnesemia 06/19/2022     Rx:  Magnesium 64 mg daily.      Osteoarthritis (arthritis due to wear and tear of joints) 04/03/2022    Impaired functional mobility, balance, gait, and endurance 03/16/2022     Ambulatory without assistance.      Morbid obesity (CMS HCC) 12/12/2021    B12 deficiency 11/08/2021    Hypertensive disorder 08/01/2021     Dr. Renda Rolls.      Mixed hyperlipidemia 08/01/2021    Gastroesophageal reflux disease without esophagitis 08/01/2021    Environmental and seasonal allergies 08/01/2021     Advised and over-the-counter antihistamine.  Patient declined respiratory screenings.        Colon cancer screening 08/01/2021    Vitamin D deficiency 08/01/2021     Vitamin-4000 IU daily and OTC calcium 1200 mg daily.      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  Last eye exam 12/18/20, Dr. Micheline Rough.         Historical Data    Past Medical History:  Past Medical History:   Diagnosis Date    Abdominal pain     Acute cystitis with hematuria 10/03/2022    Anxiety     Anxiety     B12 deficiency     Chronic back pain     Chronic constipation     Chronic left hip pain     Chronic right lower quadrant pain 10/03/2022    COVID     Cystocele with rectocele     Diabetes mellitus, type 2 (CMS HCC)     Diabetic neuropathy, type II diabetes mellitus (CMS HCC)     Encounter for screening for malignant neoplasm of breast     Esophageal reflux     Female bladder prolapse     Hypercholesterolemia  Hypertension     Intermittent claudication (CMS HCC)     Leukocytes in urine 08/16/2022    Morbid obesity with BMI of 40.0-44.9, adult (CMS HCC)     OAB (overactive bladder)     Osteoarthritis of spinal facet joint     Steatosis of liver     Vaginal vault prolapse     Vitamin D deficiency      Past Surgical History:  Past Surgical History:   Procedure Laterality Date    BLADDER SURGERY  11/2019    prolapse bladder    COLONOSCOPY      HX APPENDECTOMY      HX HYSTERECTOMY  1995    HX TUBAL LIGATION Bilateral 1993    LAMINECTOMY AND MICRODISCECTOMY LUMBAR SPINE N/A 03/19/2023    Dr. Neal Dy- [facility] forrest    REPAIR RECTOCELE  2023    Dr. Luella Cook     Allergies:  No Known Allergies  Medications:  Current Outpatient Medications   Medication Sig    aspirin (ECOTRIN) 81 mg Oral Tablet, Delayed Release (E.C.) Take 1 Tablet (81 mg total) by mouth Once a day    calcium citrate-vitamin D3 (CITRACAL) 200 mg-6.25 mcg (250 unit) Oral Tablet Take by mouth Once a day    cholecalciferol, vitamin D3, 25 mcg (1,000 unit) Oral Tablet Take 1 Tablet (1,000 Units total) by mouth Once a day     collagen/biotin/ascorbic acid (COLLAGEN 1500 PLUS C ORAL) Take 1,000 mg by mouth    cyanocobalamin (VITAMIN B 12) 1,000 mcg Oral Tablet Take 1 Tablet (1,000 mcg total) by mouth Once a day    losartan (COZAAR) 25 mg Oral Tablet Take 1 Tablet (25 mg total) by mouth Once a day for blood pressure    magnesium chloride (SLOW-MAG) 64 mg Oral Tablet, Delayed Release (E.C.) Take 1 Tablet (64 mg total) by mouth Once a day    metFORMIN (GLUCOPHAGE) 500 mg Oral Tablet Take 2 Tablets (1,000 mg total) by mouth Twice daily    multivit-min-mfolate-K-herb289 (ALIVE WOMEN'S 50 PLUS ULTRA) 800 mcg DFE- 150 mcg Oral Tablet Take 1 Tablet by mouth Once a day    nystatin (NYSTOP) 100,000 unit/gram Powder APPLY TO THE AFFECTED AREA(S) BY TOPICAL ROUTE 2 TIMES PER DAY    omega-3 fatty acid (LOVAZA) 1 gram Oral Capsule Take 2 Capsules (2 g total) by mouth Twice daily    omeprazole (PRILOSEC) 20 mg Oral Capsule, Delayed Release(E.C.) Take 1 Capsule (20 mg total) by mouth Once a day (Patient taking differently: Take 2 Capsules (40 mg total) by mouth Once a day)    potassium gluconate 600 mg (99 mg) Oral Tablet Take 1 Tablet by mouth Once a day    rosuvastatin (CRESTOR) 20 mg Oral Tablet Take 1 Tablet (20 mg total) by mouth Once a day    triamcinolone acetonide 0.1 % Cream Apply 1 Application topically Once per day as needed for Other     Family History:  Family Medical History:       Problem Relation (Age of Onset)    Breast Cancer Maternal Aunt    No Known Problems Father, Sister, Brother, Maternal Grandmother, Maternal Grandfather, Paternal Grandmother, Paternal Grandfather, Daughter, Son, Maternal Uncle, Paternal Aunt, Paternal Uncle, Other    Stomach Cancer Mother            Social History:  Social History     Socioeconomic History    Marital status: Married     Spouse name: Tabathia Meddings  Number of children: 1    Highest education level: 12th grade   Tobacco Use    Smoking status: Never    Smokeless tobacco: Never   Substance and Sexual  Activity    Alcohol use: Never    Drug use: Never    Sexual activity: Not Currently     Partners: Male     Social Determinants of Health     Financial Resource Strain: Low Risk  (11/14/2022)    Financial Resource Strain     SDOH Financial: No   Transportation Needs: Low Risk  (11/14/2022)    Transportation Needs     SDOH Transportation: No   Social Connections: Low Risk  (11/14/2022)    Social Connections     SDOH Social Isolation: 5 or more times a week   Intimate Partner Violence: Low Risk  (11/14/2022)    Intimate Partner Violence     SDOH Domestic Violence: No   Recent Concern: Intimate Partner Violence - High Risk (08/16/2022)    Intimate Partner Violence     SDOH Domestic Violence: No   Housing Stability: Low Risk  (11/14/2022)    Housing Stability     SDOH Housing Situation: I have housing.     SDOH Housing Worry: No           Review of Systems:  Any pertinent Review of Systems as addressed in the HPI above.    Physical Exam:  Vital Signs:  Vitals:    04/04/23 1323   BP: 136/80   Pulse: 83   Temp: 36.9 C (98.4 F)   SpO2: 96%   Weight: 107 kg (236 lb)       Physical Exam  Vitals reviewed.   Constitutional:       Appearance: She is morbidly obese.   Cardiovascular:      Rate and Rhythm: Normal rate and regular rhythm.      Pulses: Normal pulses.      Heart sounds: Normal heart sounds, S1 normal and S2 normal.   Pulmonary:      Effort: Pulmonary effort is normal.      Breath sounds: Normal breath sounds.   Abdominal:      General: Bowel sounds are normal.      Palpations: Abdomen is soft.   Musculoskeletal:      Cervical back: Neck supple.      Right lower leg: No edema.      Left lower leg: No edema.   Skin:     General: Skin is warm.             Comments: Lumbar spine surgical incision healing well; scab dry and intact.  No erythema, edema or drainage noted.    Neurological:      General: No focal deficit present.      Mental Status: She is alert and oriented to person, place, and time.      Cranial Nerves:  Cranial nerves 2-12 are intact.      Motor: Weakness present.      Coordination: Coordination abnormal.      Gait: Gait abnormal (in a wheelchair today; standing with assistance).   Psychiatric:         Mood and Affect: Mood normal.         Behavior: Behavior normal. Behavior is cooperative.         Thought Content: Thought content normal.         Cognition and Memory: Cognition normal.  Judgment: Judgment normal.          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)  (primary encounter diagnosis)  Plan: HGA1C (HEMOGLOBIN A1C WITH EST AVG GLUCOSE),         MICROALBUMIN/CREATININE RATIO, URINE, RANDOM,         CBC, COMPREHENSIVE METABOLIC PNL, FASTING,         LIPID PANEL, MAGNESIUM, THYROID STIMULATING         HORMONE (SENSITIVE TSH), VITAMIN D 25 TOTAL,         VITAMIN B12, FERRITIN, IRON TRANSFERRIN AND         TIBC    (I10) Hypertensive disorder  Plan: HGA1C (HEMOGLOBIN A1C WITH EST AVG GLUCOSE),         MICROALBUMIN/CREATININE RATIO, URINE, RANDOM,         CBC, COMPREHENSIVE METABOLIC PNL, FASTING,         LIPID PANEL, MAGNESIUM, THYROID STIMULATING         HORMONE (SENSITIVE TSH), VITAMIN D 25 TOTAL,         VITAMIN B12, FERRITIN, IRON TRANSFERRIN AND         TIBC    (E78.2) Mixed hyperlipidemia  Plan: HGA1C (HEMOGLOBIN A1C WITH EST AVG GLUCOSE),         MICROALBUMIN/CREATININE RATIO, URINE, RANDOM,         CBC, COMPREHENSIVE METABOLIC PNL, FASTING,         LIPID PANEL, MAGNESIUM, THYROID STIMULATING         HORMONE (SENSITIVE TSH), VITAMIN D 25 TOTAL,         VITAMIN B12, FERRITIN, IRON TRANSFERRIN AND         TIBC    (K21.9) Gastroesophageal reflux disease without esophagitis  Plan: HGA1C (HEMOGLOBIN A1C WITH EST AVG GLUCOSE),         MICROALBUMIN/CREATININE RATIO, URINE, RANDOM,         CBC, COMPREHENSIVE METABOLIC PNL, FASTING,         LIPID PANEL, MAGNESIUM, THYROID STIMULATING         HORMONE (SENSITIVE TSH), VITAMIN D 25 TOTAL,         VITAMIN B12, FERRITIN,  IRON TRANSFERRIN AND         TIBC    (D50.9) Iron deficiency anemia  Plan: HGA1C (HEMOGLOBIN A1C WITH EST AVG GLUCOSE),         MICROALBUMIN/CREATININE RATIO, URINE, RANDOM,         CBC, COMPREHENSIVE METABOLIC PNL, FASTING,         LIPID PANEL, MAGNESIUM, THYROID STIMULATING         HORMONE (SENSITIVE TSH), VITAMIN D 25 TOTAL,         VITAMIN B12, FERRITIN, IRON TRANSFERRIN AND         TIBC    (E55.9) Vitamin D deficiency  Plan: HGA1C (HEMOGLOBIN A1C WITH EST AVG GLUCOSE),         MICROALBUMIN/CREATININE RATIO, URINE, RANDOM,         CBC, COMPREHENSIVE METABOLIC PNL, FASTING,         LIPID PANEL, MAGNESIUM, THYROID STIMULATING         HORMONE (SENSITIVE TSH), VITAMIN D 25 TOTAL,         VITAMIN B12, FERRITIN, IRON TRANSFERRIN AND         TIBC    (E53.8) B12 deficiency  Plan: HGA1C (HEMOGLOBIN A1C WITH EST AVG GLUCOSE),         MICROALBUMIN/CREATININE RATIO, URINE,  RANDOM,         CBC, COMPREHENSIVE METABOLIC PNL, FASTING,         LIPID PANEL, MAGNESIUM, THYROID STIMULATING         HORMONE (SENSITIVE TSH), VITAMIN D 25 TOTAL,         VITAMIN B12, FERRITIN, IRON TRANSFERRIN AND         TIBC    (K76.0) Fatty liver  Plan: HGA1C (HEMOGLOBIN A1C WITH EST AVG GLUCOSE),         MICROALBUMIN/CREATININE RATIO, URINE, RANDOM,         CBC, COMPREHENSIVE METABOLIC PNL, FASTING,         LIPID PANEL, MAGNESIUM, THYROID STIMULATING         HORMONE (SENSITIVE TSH), VITAMIN D 25 TOTAL,         VITAMIN B12, FERRITIN, IRON TRANSFERRIN AND         TIBC    (E83.42) Hypomagnesemia  Plan: HGA1C (HEMOGLOBIN A1C WITH EST AVG GLUCOSE),         MICROALBUMIN/CREATININE RATIO, URINE, RANDOM,         CBC, COMPREHENSIVE METABOLIC PNL, FASTING,         LIPID PANEL, MAGNESIUM, THYROID STIMULATING         HORMONE (SENSITIVE TSH), VITAMIN D 25 TOTAL,         VITAMIN B12, FERRITIN, IRON TRANSFERRIN AND         TIBC    (Z74.09) Impaired functional mobility, balance, gait, and endurance    (E66.01) Morbid obesity (CMS HCC)    (Z91.81) Risk for  falls               Problem List Items Addressed This Visit          Cardiovascular System    Hypertensive disorder     BP stable and asymptomatic.  Continue current medications.  Avoid OTC NSAID'S.  Limit sodium in diet.         Relevant Orders    HGA1C (HEMOGLOBIN A1C WITH EST AVG GLUCOSE)    MICROALBUMIN/CREATININE RATIO, URINE, RANDOM    CBC    COMPREHENSIVE METABOLIC PNL, FASTING    LIPID PANEL    MAGNESIUM    THYROID STIMULATING HORMONE (SENSITIVE TSH)    VITAMIN D 25 TOTAL    VITAMIN B12    FERRITIN    IRON TRANSFERRIN AND TIBC    Mixed hyperlipidemia     Continue Lovaza and Crestor 20 mg daily.  Advised to limit high fat foods and processed foods in diet.         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  Continue current medications.  Advised a diabetic diet and daily physical exercise.         Relevant Orders    HGA1C (HEMOGLOBIN A1C WITH EST AVG GLUCOSE)    MICROALBUMIN/CREATININE RATIO, URINE, RANDOM    CBC    COMPREHENSIVE METABOLIC PNL, FASTING    LIPID PANEL  MAGNESIUM    THYROID STIMULATING HORMONE (SENSITIVE TSH)    VITAMIN D 25 TOTAL    VITAMIN B12    FERRITIN    IRON TRANSFERRIN AND TIBC       Digestive    Gastroesophageal reflux disease without esophagitis     Managed by Dr. Concha Se.           Relevant Orders    HGA1C (HEMOGLOBIN A1C WITH EST AVG GLUCOSE)    MICROALBUMIN/CREATININE RATIO, URINE, RANDOM    CBC    COMPREHENSIVE METABOLIC PNL, FASTING    LIPID PANEL    MAGNESIUM    THYROID STIMULATING HORMONE (SENSITIVE TSH)    VITAMIN D 25 TOTAL    VITAMIN B12    FERRITIN    IRON TRANSFERRIN AND TIBC    Fatty liver    Relevant Orders     HGA1C (HEMOGLOBIN A1C WITH EST AVG GLUCOSE)    MICROALBUMIN/CREATININE RATIO, URINE, RANDOM    CBC    COMPREHENSIVE METABOLIC PNL, FASTING    LIPID PANEL    MAGNESIUM    THYROID STIMULATING HORMONE (SENSITIVE TSH)    VITAMIN D 25 TOTAL    VITAMIN B12    FERRITIN    IRON TRANSFERRIN AND TIBC       Endocrine    Vitamin D deficiency     Vitamin D 80 - Continue vitamin D 4000 IU daily.         Relevant Orders    HGA1C (HEMOGLOBIN A1C WITH EST AVG GLUCOSE)    MICROALBUMIN/CREATININE RATIO, URINE, RANDOM    CBC    COMPREHENSIVE METABOLIC PNL, FASTING    LIPID PANEL    MAGNESIUM    THYROID STIMULATING HORMONE (SENSITIVE TSH)    VITAMIN D 25 TOTAL    VITAMIN B12    FERRITIN    IRON TRANSFERRIN AND TIBC    B12 deficiency     Vitamin B12 764 - Continue vitamin B12 1000 mcg daily.         Relevant Orders    HGA1C (HEMOGLOBIN A1C WITH EST AVG GLUCOSE)    MICROALBUMIN/CREATININE RATIO, URINE, RANDOM    CBC    COMPREHENSIVE METABOLIC PNL, FASTING    LIPID PANEL    MAGNESIUM    THYROID STIMULATING HORMONE (SENSITIVE TSH)    VITAMIN D 25 TOTAL    VITAMIN B12    FERRITIN    IRON TRANSFERRIN AND TIBC       Hematologic/Lymphatic    Iron deficiency anemia     Continue ferrous sulfate 325mg  BID.           Relevant Orders    HGA1C (HEMOGLOBIN A1C WITH EST AVG GLUCOSE)    MICROALBUMIN/CREATININE RATIO, URINE, RANDOM    CBC    COMPREHENSIVE METABOLIC PNL, FASTING    LIPID PANEL    MAGNESIUM    THYROID STIMULATING HORMONE (SENSITIVE TSH)    VITAMIN D 25 TOTAL    VITAMIN B12    FERRITIN    IRON TRANSFERRIN AND TIBC       Other    Morbid obesity (CMS HCC)     Advised a low-fat/low sodium diet, advised 150 minutes of scheduled weekly physical activity as tolerated.  Advised to maintain a healthy weight.           Impaired functional mobility, balance, gait, and endurance     Discussed fall risk.  Denies history of falls.  Patient will continue activity as recommended by [facility] Orthopedic Surgeon  Hypomagnesemia    Relevant  Orders    HGA1C (HEMOGLOBIN A1C WITH EST AVG GLUCOSE)    MICROALBUMIN/CREATININE RATIO, URINE, RANDOM    CBC    COMPREHENSIVE METABOLIC PNL, FASTING    LIPID PANEL    MAGNESIUM    THYROID STIMULATING HORMONE (SENSITIVE TSH)    VITAMIN D 25 TOTAL    VITAMIN B12    FERRITIN    IRON TRANSFERRIN AND TIBC    Risk for falls     Discussed fall risk.              Tobacco cessation counseling not performed due to nonsmoker.     Discussed labs today.  Continue current medications as prescribed. Advised to follow up with specialists as scheduled.   Advised a low-fat/low sodium diet, advised 150 minutes of scheduled weekly physical activity as tolerated.  Advised to maintain a healthy weight.     Return in about 3 months (around 07/03/2023) for routine visit.    Stormy Fabian, NP-C     Portions of this note may be dictated using voice recognition software or a dictation service. Variances in spelling and vocabulary are possible and unintentional. Not all errors are caught/corrected. Please notify the [facility] Parkin if any discrepancies are noted or if the meaning of any statement is not clear.

## 2023-04-04 NOTE — Nursing Note (Signed)
Patient here for follow up, patient wishes to increase omeprazole to 40 mg. Patient states Amber Owens increased it to 40 mg.

## 2023-04-08 ENCOUNTER — Encounter (RURAL_HEALTH_CENTER): Payer: Self-pay | Admitting: Family

## 2023-04-08 NOTE — Assessment & Plan Note (Signed)
Discussed fall risk.

## 2023-04-08 NOTE — Assessment & Plan Note (Addendum)
Managed by Dr. K Patel.

## 2023-04-08 NOTE — Assessment & Plan Note (Signed)
Continue ferrous sulfate 325mg BID.

## 2023-04-08 NOTE — Assessment & Plan Note (Signed)
Discussed fall risk.  Denies history of falls.  Patient will continue activity as recommended by [facility] Orthopedic Surgeon

## 2023-04-08 NOTE — Assessment & Plan Note (Signed)
Vitamin D 80 - Continue vitamin D 4000 IU daily.

## 2023-04-08 NOTE — Assessment & Plan Note (Signed)
Continue Lovaza and Crestor 20 mg daily.  Advised to limit high fat foods and processed foods in diet.Marland Kitchen

## 2023-04-08 NOTE — Assessment & Plan Note (Addendum)
A1c 6.0.  Continue current medications.  Advised a diabetic diet and daily physical exercise.

## 2023-04-08 NOTE — Assessment & Plan Note (Signed)
BP stable and asymptomatic.  Continue current medications.  Avoid OTC NSAID'S.  Limit sodium in diet.

## 2023-04-08 NOTE — Assessment & Plan Note (Signed)
Advised a low-fat/low sodium diet, advised 150 minutes of scheduled weekly physical activity as tolerated.  Advised to maintain a healthy weight.

## 2023-04-08 NOTE — Assessment & Plan Note (Signed)
Vitamin B12 764 - Continue vitamin B12 1000 mcg daily.

## 2023-04-17 ENCOUNTER — Other Ambulatory Visit (RURAL_HEALTH_CENTER): Payer: Self-pay | Admitting: Family

## 2023-04-18 ENCOUNTER — Ambulatory Visit (RURAL_HEALTH_CENTER): Payer: Self-pay | Admitting: Family

## 2023-04-18 ENCOUNTER — Other Ambulatory Visit (RURAL_HEALTH_CENTER): Payer: Self-pay | Admitting: Family

## 2023-04-18 MED ORDER — OMEPRAZOLE 40 MG CAPSULE,DELAYED RELEASE
40.0000 mg | DELAYED_RELEASE_CAPSULE | Freq: Every day | ORAL | 1 refills | Status: DC
Start: 2023-04-18 — End: 2023-12-03

## 2023-04-19 DIAGNOSIS — Z9889 Other specified postprocedural states: Secondary | ICD-10-CM | POA: Insufficient documentation

## 2023-07-03 ENCOUNTER — Other Ambulatory Visit: Payer: Self-pay

## 2023-07-03 ENCOUNTER — Ambulatory Visit (RURAL_HEALTH_CENTER): Payer: Self-pay | Attending: Family | Admitting: Family

## 2023-07-03 ENCOUNTER — Encounter (RURAL_HEALTH_CENTER): Payer: Self-pay | Admitting: Family

## 2023-07-03 VITALS — BP 136/67 | HR 78 | Temp 97.8°F | Wt 239.0 lb

## 2023-07-03 DIAGNOSIS — E782 Mixed hyperlipidemia: Secondary | ICD-10-CM | POA: Insufficient documentation

## 2023-07-03 DIAGNOSIS — K219 Gastro-esophageal reflux disease without esophagitis: Secondary | ICD-10-CM | POA: Insufficient documentation

## 2023-07-03 DIAGNOSIS — E559 Vitamin D deficiency, unspecified: Secondary | ICD-10-CM | POA: Insufficient documentation

## 2023-07-03 DIAGNOSIS — M539 Dorsopathy, unspecified: Secondary | ICD-10-CM

## 2023-07-03 DIAGNOSIS — E1142 Type 2 diabetes mellitus with diabetic polyneuropathy: Secondary | ICD-10-CM | POA: Insufficient documentation

## 2023-07-03 DIAGNOSIS — E538 Deficiency of other specified B group vitamins: Secondary | ICD-10-CM | POA: Insufficient documentation

## 2023-07-03 DIAGNOSIS — Z7984 Long term (current) use of oral hypoglycemic drugs: Secondary | ICD-10-CM | POA: Insufficient documentation

## 2023-07-03 DIAGNOSIS — Z7409 Other reduced mobility: Secondary | ICD-10-CM

## 2023-07-03 DIAGNOSIS — I10 Essential (primary) hypertension: Secondary | ICD-10-CM | POA: Insufficient documentation

## 2023-07-03 DIAGNOSIS — Z9181 History of falling: Secondary | ICD-10-CM

## 2023-07-03 DIAGNOSIS — Z23 Encounter for immunization: Secondary | ICD-10-CM | POA: Insufficient documentation

## 2023-07-03 MED ORDER — PIOGLITAZONE 30 MG TABLET
30.0000 mg | ORAL_TABLET | Freq: Every day | ORAL | 1 refills | Status: DC
Start: 2023-07-03 — End: 2023-10-06

## 2023-07-03 NOTE — Progress Notes (Addendum)
 FAMILY MEDICINE, [facility] FAMILY MEDICINE [facility]  [address]  [place] [address]  Operated by [facility]     Name: Amber Owens MRN:  [id number]   Date of Birth: 06-04-1955 Age: 68 y.o.   Date: 07/03/2023  Time: 14:09     Provider: Stormy Fabian, NP-C    Reason for visit: Follow Up 3 Months      History of Present Illness:  Amber Owens is a 68 y.o. female presenting with chronic disease management.      Reports she is doing well.  Patient denies numbness or tingling in extremities.  Denies loss of control of bowel or bladder. Patient is in a wheelchair at home but reports she is walking more at home. Patient reports she is under the care of Dr. 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  Patient is under the care of of Dr. Simon Rhein at [facility].        Patient Active Problem List    Diagnosis Date Noted    Lumbar facet arthropathy 12/04/2022    Multilevel degenerative disc disease 11/14/2022    Disc-osteophyte complex 11/14/2022    Osteopenia after menopause 11/14/2022      DEXA 10/25/2022 indicates osteopenia.       Fatty liver 11/14/2022     Dr. Concha Se.      Need for shingles vaccine 10/12/2022    Postmenopausal status 10/12/2022    Gait abnormality 10/12/2022    Risk for falls 10/12/2022    Colon cancer high risk 10/12/2022    Low back pain 08/16/2022    Hypomagnesemia 06/19/2022     Rx:  Magnesium 64 mg daily.      Osteoarthritis (arthritis due to wear and tear of joints) 04/03/2022    Impaired functional mobility, balance, gait, and endurance 03/16/2022    Morbid obesity (CMS HCC) 12/12/2021    B12 deficiency 11/08/2021    Hypertensive disorder 08/01/2021     Dr. Renda Rolls.      Mixed hyperlipidemia 08/01/2021    Gastroesophageal reflux disease without esophagitis 08/01/2021    Environmental and seasonal allergies 08/01/2021     Advised and over-the-counter antihistamine.  Patient declined respiratory screenings.       Colon cancer screening 08/01/2021     Vitamin D deficiency 08/01/2021     Vitamin-4000 IU daily and OTC calcium 1200 mg daily.      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  Last eye exam 12/18/20, Dr. Micheline Rough.         Historical Data    Past Medical History:  Past Medical History:   Diagnosis Date    Abdominal pain     Acute cystitis with hematuria 10/03/2022    Anxiety     Anxiety 08/01/2021    B12 deficiency     Chronic back pain     Chronic constipation 06/27/2018    Chronic left hip pain     Chronic right lower quadrant pain 10/03/2022    COVID     Cystocele with rectocele     Diabetes mellitus, type 2 (CMS HCC)     Diabetic neuropathy, type II diabetes mellitus (CMS HCC)     Encounter for screening for malignant neoplasm of breast     Esophageal reflux     Female bladder prolapse     Hypercholesterolemia     Hypertension     Intermittent  claudication (CMS HCC)     Iron deficiency anemia 06/19/2022    Leukocytes in urine 08/16/2022    Morbid obesity with BMI of 40.0-44.9, adult (CMS HCC)     OAB (overactive bladder) 06/27/2018    Osteoarthritis of spinal facet joint     Steatosis of liver     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 Neal Dy- [facility]    REPAIR RECTOCELE  2023    Dr. Luella Cook     Allergies:  No Known Allergies  Medications:  Current Outpatient Medications   Medication Sig    acetaminophen (TYLENOL) 500 mg Oral Tablet Take 1 Tablet (500 mg total) by mouth Every 6 hours as needed    aspirin (ECOTRIN) 81 mg Oral Tablet, Delayed Release (E.C.) Take 1 Tablet (81 mg total) by mouth Once a day    calcium citrate-vitamin D3 (CITRACAL) 200 mg-6.25 mcg (250 unit) Oral Tablet Take by mouth  Once a day    cholecalciferol, vitamin D3, 25 mcg (1,000 unit) Oral Tablet Take 1 Tablet (1,000 Units total) by mouth Once a day    collagen/biotin/ascorbic acid (COLLAGEN 1500 PLUS C ORAL) Take 1,000 mg by mouth    cyanocobalamin (VITAMIN B 12) 1,000 mcg Oral Tablet Take 1 Tablet (1,000 mcg total) by mouth Once a day    losartan (COZAAR) 25 mg Oral Tablet Take 1 Tablet (25 mg total) by mouth Once a day for blood pressure    magnesium chloride (SLOW-MAG) 64 mg Oral Tablet, Delayed Release (E.C.) Take 1 Tablet (64 mg total) by mouth Once a day    metFORMIN (GLUCOPHAGE) 500 mg Oral Tablet Take 2 Tablets (1,000 mg total) by mouth Twice daily    multivit-min-mfolate-K-herb289 (ALIVE WOMEN'S 50 PLUS ULTRA) 800 mcg DFE- 150 mcg Oral Tablet Take 1 Tablet by mouth Once a day    nystatin (NYSTOP) 100,000 unit/gram Powder APPLY TO THE AFFECTED AREA(S) BY TOPICAL ROUTE 2 TIMES PER DAY    omega-3 fatty acid (LOVAZA) 1 gram Oral Capsule Take 2 Capsules (2 g total) by mouth Twice daily    omeprazole (PRILOSEC) 40 mg Oral Capsule, Delayed Release(E.C.) Take 1 Capsule (40 mg total) by mouth Once a day Indications: bleeding from stomach, esophagus or duodenum    pioglitazone (ACTOS) 30 mg Oral Tablet Take 1 Tablet (30 mg total) by mouth Once a day for 90 days    potassium gluconate 600 mg (99 mg) Oral Tablet Take 1 Tablet by mouth Once a day    rosuvastatin (CRESTOR) 20 mg Oral Tablet Take 1 Tablet (20 mg total) by mouth Once a day    triamcinolone acetonide 0.1 % Cream Apply 1 Application topically Once per day as needed for Other     Family History:  Family Medical History:       Problem Relation (Age of Onset)    Breast Cancer Maternal Aunt    No Known Problems Father, Sister, Brother, Maternal Grandmother, Maternal Grandfather, Paternal Grandmother, Paternal Grandfather, Daughter, Son, Maternal Uncle, Paternal Aunt, Paternal Uncle, Other    Stomach Cancer Mother  Social History:  Social History     Socioeconomic  History    Marital status: Married     Spouse name: Tawny Raspberry    Number of children: 1    Highest education level: 12th grade   Tobacco Use    Smoking status: Never    Smokeless tobacco: Never   Substance and Sexual Activity    Alcohol use: Never    Drug use: Never    Sexual activity: Not Currently     Partners: Male     Social Determinants of Health     Financial Resource Strain: Low Risk  (11/14/2022)    Financial Resource Strain     SDOH Financial: No   Transportation Needs: Low Risk  (11/14/2022)    Transportation Needs     SDOH Transportation: No   Social Connections: Low Risk  (11/14/2022)    Social Connections     SDOH Social Isolation: 5 or more times a week   Intimate Partner Violence: Low Risk  (11/14/2022)    Intimate Partner Violence     SDOH Domestic Violence: No   Recent Concern: Intimate Partner Violence - High Risk (08/16/2022)    Intimate Partner Violence     SDOH Domestic Violence: No   Housing Stability: Low Risk  (11/14/2022)    Housing Stability     SDOH Housing Situation: I have housing.     SDOH Housing Worry: No           Review of Systems:  Any pertinent Review of Systems as addressed in the HPI above.    Physical Exam:  Vital Signs:  Vitals:    07/03/23 1407   BP: 136/67   Pulse: 78   Temp: 36.6 C (97.8 F)   SpO2: 97%   Weight: 108 kg (239 lb)         Physical Exam  Vitals reviewed.   Constitutional:       Appearance: She is morbidly obese.   Cardiovascular:      Rate and Rhythm: Normal rate and regular rhythm.      Pulses: Normal pulses.      Heart sounds: Normal heart sounds, S1 normal and S2 normal.   Pulmonary:      Effort: Pulmonary effort is normal.      Breath sounds: Normal breath sounds.   Abdominal:      General: Bowel sounds are normal.      Palpations: Abdomen is soft.   Musculoskeletal:      Cervical back: Neck supple.      Right lower leg: No edema.      Left lower leg: No edema.   Skin:     General: Skin is warm.   Neurological:      General: No focal deficit present.       Mental Status: She is alert and oriented to person, place, and time.      Cranial Nerves: Cranial nerves 2-12 are intact.      Motor: Weakness present.      Coordination: Coordination abnormal.      Gait: Gait abnormal (in a wheelchair today; standing with assistance).   Psychiatric:         Mood and Affect: Mood normal.         Behavior: Behavior normal. Behavior is cooperative.         Thought Content: Thought content normal.         Cognition and Memory: Cognition normal.  Judgment: Judgment normal.          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)  (primary encounter diagnosis)    (I10) Hypertensive disorder    (E78.2) Mixed hyperlipidemia    (K21.9) Gastroesophageal reflux disease without esophagitis    (E66.01) Morbid obesity (CMS HCC)    (Z23) Immunization due  Plan: Tdap Vaccine (Admin)    (M53.9) Multilevel degenerative disc disease    (Z74.09) Impaired functional mobility, balance, gait, and endurance    (Z91.81) Risk for falls    (E53.8) B12 deficiency    (E55.9) Vitamin D deficiency    (E83.42) Hypomagnesemia                 Problem List Items Addressed This Visit          Cardiovascular System    Hypertensive disorder    Creatinine 0.74.  Normal CMP.    BP stable and asymptomatic.  Continue current medications.  Avoid OTC NSAID'S.  Limit sodium in diet.         Mixed hyperlipidemia    Total cholesterol 156, triglycerides 160, HDL 46, LDL 82. Continue Lovaza and Crestor 20 mg daily.  Advised to limit high fat foods and processed foods in diet.            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  Continue current medications.  Patient reports she has been sedentary and has not been eating well due to chronic pain.  Reports she wants to try and work on diet and physical activity as well as weight loss in the next 3 months.  Declines medication changes at this time.    Advised a diabetic diet and advised  20-30 minutes of daily physical activity.            Digestive    Gastroesophageal reflux disease without esophagitis    AST 20, ALT 19. Managed by Dr. Concha Se.              Endocrine    Vitamin D deficiency    Vitamin D 41 - Continue OTC vitamin D 4000 IU daily.         B12 deficiency    Vitamin B12 697 - Continue vitamin B12 1000 mcg daily.            Musculoskeletal    Multilevel degenerative disc disease    Discussed MRI results.  MRI faxed to [facility] on 12/26/2022      Managed by Dr. Simon Rhein, Spine Specialist at [facility]  [address]  [place], [place]  [address]  [phone]            Other    Morbid obesity (CMS [facility])    Advised a low-fat/low sodium diet, advised 150 minutes of scheduled weekly physical activity as tolerated.  Advised to maintain a healthy weight.           Impaired functional mobility, balance, gait, and endurance    Discussed fall risk.  Denies history of falls.  Patient will continue activity as recommended by [facility] Orthopedic Surgeon         Hypomagnesemia    Continue magnesium as prescribed.         Risk for falls    Discussed fall risk.  Other Visit Diagnoses         Immunization due        Relevant Orders    Tdap Vaccine (Admin) (Completed)               Tobacco cessation counseling not performed due to nonsmoker.     Discussed labs today.  Continue current medications as prescribed. Advised to follow up with specialists as scheduled.   Advised a low-fat/low sodium diet, advised 150 minutes of scheduled weekly physical activity as tolerated.  Advised to maintain a healthy weight.     Return in about 3 months (around 10/03/2023) for routine visit; schedule medicare telehealth visit.    Stormy Fabian, NP-C     Portions of this note may be dictated using voice recognition software or a dictation service. Variances in spelling and vocabulary are possible and unintentional. Not all errors are caught/corrected.  Please notify the There[facility] Parkin if any discrepancies are noted or if the meaning of any statement is not clear.

## 2023-07-03 NOTE — Assessment & Plan Note (Addendum)
 Total cholesterol 156, triglycerides 160, HDL 46, LDL 82. Continue Lovaza and Crestor 20 mg daily.  Advised to limit high fat foods and processed foods in diet.

## 2023-07-03 NOTE — Assessment & Plan Note (Addendum)
 Vitamin B12 697 - Continue vitamin B12 1000 mcg daily.

## 2023-07-03 NOTE — Assessment & Plan Note (Addendum)
 A1c 7.0 .  Continue current medications.  Patient reports she has been sedentary and has not been eating well due to chronic pain.  Reports she wants to try and work on diet and physical activity as well as weight loss in the next 3 months.  Declines medication changes at this time.    Advised a diabetic diet and advised 20-30 minutes of daily physical activity.

## 2023-07-03 NOTE — Assessment & Plan Note (Addendum)
 Creatinine 0.74.  Normal CMP.    BP stable and asymptomatic.  Continue current medications.  Avoid OTC NSAID'S.  Limit sodium in diet.

## 2023-07-03 NOTE — Assessment & Plan Note (Addendum)
 AST 20, ALT 19. Managed by Dr. Concha Se.

## 2023-07-03 NOTE — Nursing Note (Signed)
 Patient here for follow up with no new complaints

## 2023-07-03 NOTE — Assessment & Plan Note (Addendum)
 Vitamin D 63 - Continue OTC vitamin D 4000 IU daily.

## 2023-07-15 ENCOUNTER — Encounter (RURAL_HEALTH_CENTER): Payer: Self-pay | Admitting: Family

## 2023-07-15 NOTE — Assessment & Plan Note (Signed)
 Continue magnesium as prescribed.

## 2023-07-15 NOTE — Assessment & Plan Note (Signed)
 Advised a low-fat/low sodium diet, advised 150 minutes of scheduled weekly physical activity as tolerated.  Advised to maintain a healthy weight.

## 2023-07-15 NOTE — Assessment & Plan Note (Signed)
 Discussed fall risk.

## 2023-07-15 NOTE — Assessment & Plan Note (Signed)
 Discussed fall risk.  Denies history of falls.  Patient will continue activity as recommended by [facility] Orthopedic Surgeon

## 2023-07-15 NOTE — Assessment & Plan Note (Signed)
 Discussed MRI results.  MRI faxed to [facility] on 12/26/2022      Managed by Dr. Simon Rhein, Spine Specialist at [facility]  [address]  [place], [place]  [address]  [phone]

## 2023-08-09 ENCOUNTER — Other Ambulatory Visit (HOSPITAL_COMMUNITY): Payer: Self-pay | Admitting: Orthopaedic Surgery

## 2023-08-09 ENCOUNTER — Other Ambulatory Visit: Payer: Self-pay

## 2023-08-09 ENCOUNTER — Ambulatory Visit
Admission: RE | Admit: 2023-08-09 | Discharge: 2023-08-09 | Disposition: A | Discharge status: Home / Self Care | Source: Ambulatory Visit | Attending: Orthopaedic Surgery | Admitting: Orthopaedic Surgery

## 2023-08-09 DIAGNOSIS — Z01810 Encounter for preprocedural cardiovascular examination: Secondary | ICD-10-CM

## 2023-08-09 DIAGNOSIS — I440 Atrioventricular block, first degree: Secondary | ICD-10-CM

## 2023-08-09 DIAGNOSIS — R9431 Abnormal electrocardiogram [ECG] [EKG]: Secondary | ICD-10-CM

## 2023-08-09 DIAGNOSIS — I4510 Unspecified right bundle-branch block: Secondary | ICD-10-CM

## 2023-08-09 DIAGNOSIS — Z01818 Encounter for other preprocedural examination: Secondary | ICD-10-CM

## 2023-08-09 DIAGNOSIS — I252 Old myocardial infarction: Secondary | ICD-10-CM

## 2023-08-09 LAB — ECG 12 LEAD
Atrial Rate: 77 {beats}/min
Calculated P Axis: 75 degrees
Calculated R Axis: 108 degrees
Calculated T Axis: -56 degrees
PR Interval: 232 ms
QRS Duration: 148 ms
QT Interval: 448 ms
QTC Calculation: 506 ms
Ventricular rate: 77 {beats}/min

## 2023-09-11 ENCOUNTER — Encounter (RURAL_HEALTH_CENTER): Payer: Self-pay | Admitting: Family

## 2023-09-11 ENCOUNTER — Other Ambulatory Visit: Payer: Self-pay

## 2023-09-11 ENCOUNTER — Ambulatory Visit (RURAL_HEALTH_CENTER): Attending: Family | Admitting: Family

## 2023-09-11 ENCOUNTER — Ambulatory Visit: Attending: Family | Admitting: Family

## 2023-09-11 VITALS — BP 135/79 | HR 79 | Temp 98.7°F | Wt 235.0 lb

## 2023-09-11 DIAGNOSIS — Z87440 Personal history of urinary (tract) infections: Secondary | ICD-10-CM | POA: Insufficient documentation

## 2023-09-11 DIAGNOSIS — N390 Urinary tract infection, site not specified: Secondary | ICD-10-CM | POA: Insufficient documentation

## 2023-09-11 DIAGNOSIS — Z6839 Body mass index (BMI) 39.0-39.9, adult: Secondary | ICD-10-CM | POA: Insufficient documentation

## 2023-09-11 DIAGNOSIS — B3731 Acute candidiasis of vulva and vagina: Secondary | ICD-10-CM | POA: Insufficient documentation

## 2023-09-11 DIAGNOSIS — R531 Weakness: Secondary | ICD-10-CM | POA: Insufficient documentation

## 2023-09-11 DIAGNOSIS — E669 Obesity, unspecified: Secondary | ICD-10-CM | POA: Insufficient documentation

## 2023-09-11 DIAGNOSIS — R269 Unspecified abnormalities of gait and mobility: Secondary | ICD-10-CM | POA: Insufficient documentation

## 2023-09-11 HISTORY — DX: Acute candidiasis of vulva and vagina: B37.31

## 2023-09-11 HISTORY — DX: Urinary tract infection, site not specified: N39.0

## 2023-09-11 MED ORDER — NITROFURANTOIN MONOHYDRATE/MACROCRYSTALS 100 MG CAPSULE
100.0000 mg | ORAL_CAPSULE | Freq: Two times a day (BID) | ORAL | 0 refills | Status: DC
Start: 2023-09-11 — End: 2023-09-26

## 2023-09-11 MED ORDER — FLUCONAZOLE 150 MG TABLET
ORAL_TABLET | ORAL | 0 refills | Status: DC
Start: 2023-09-11 — End: 2023-10-06

## 2023-09-11 NOTE — Nursing Note (Signed)
 Patient complains of pressure, lower abdominal pain, pain with urination and urgency with urination x 1 week

## 2023-09-12 ENCOUNTER — Ambulatory Visit (RURAL_HEALTH_CENTER): Payer: Self-pay | Admitting: Family

## 2023-09-12 DIAGNOSIS — E611 Iron deficiency: Secondary | ICD-10-CM

## 2023-09-12 HISTORY — DX: Iron deficiency: E61.1

## 2023-09-12 NOTE — Assessment & Plan Note (Signed)
 Continue Diflucan as prescribed.  Advised follow-up if symptoms progress or do not resolve.

## 2023-09-12 NOTE — Assessment & Plan Note (Signed)
 Further treatment pending urine culture.  Start Macrobid 100 mg p.o. b.i.d. times 10 days for urinary tract infection.  Advised to hydrate well with water.  Advised to avoid caffeinated beverages.  Advise patient 10 oz of cranberry juice daily.  Advised follow-up if symptoms progress or do not resolve.

## 2023-09-12 NOTE — Progress Notes (Signed)
 [facility] MEDICINE [facility]  [facility] FAMILY MEDICINE      Amber Owens  MRN: [id number]  DOB: 09-17-1955  Date of Service: 09/12/2023    CHIEF COMPLAINT  Chief Complaint   Patient presents with    Urinary Tract Infection       SUBJECTIVE  Amber Owens is a 68 y.o. female who presents to clinic for suprapubic discomfort and pain with urinating x1 week.  History urinary tract infection.  Denies hematuria.  Patient reports she also has a white thin vaginal discharge.  Denies flank pain.  Denies fever, nausea, vomiting or diarrhea.  Denies chest pains or shortness for breath.  Denies antibiotic use in past 30 days.  Denies recent hospitalizations or surgeries.      Review of Systems:  Positive ROS discussed in HPI, otherwise all other systems negative.      Medications:   acetaminophen (TYLENOL) 500 mg Oral Tablet, Take 1 Tablet (500 mg total) by mouth Every 6 hours as needed  aspirin (ECOTRIN) 81 mg Oral Tablet, Delayed Release (E.C.), Take 1 Tablet (81 mg total) by mouth Daily  calcium citrate-vitamin D3 (CITRACAL) 200 mg-6.25 mcg (250 unit) Oral Tablet, Take by mouth Once a day  cholecalciferol, vitamin D3, 25 mcg (1,000 unit) Oral Tablet, Take 1 Tablet (1,000 Units total) by mouth Daily  collagen/biotin/ascorbic acid (COLLAGEN 1500 PLUS C ORAL), Take 1,000 mg by mouth  cyanocobalamin  (VITAMIN B 12) 1,000 mcg Oral Tablet, Take 1 Tablet (1,000 mcg total) by mouth Once a day  losartan  (COZAAR ) 25 mg Oral Tablet, Take 1 Tablet (25 mg total) by mouth Once a day for blood pressure  magnesium  chloride (SLOW-MAG) 64 mg Oral Tablet, Delayed Release (E.C.), Take 1 Tablet (64 mg total) by mouth Once a day  metFORMIN  (GLUCOPHAGE ) 500 mg Oral Tablet, Take 2 Tablets (1,000 mg total) by mouth Twice daily  multivit-min-mfolate-K-herb289 (ALIVE WOMEN'S 50 PLUS ULTRA) 800 mcg DFE- 150 mcg Oral Tablet, Take 1 Tablet by mouth Once a day  nystatin (NYSTOP) 100,000 unit/gram Powder, APPLY TO THE AFFECTED AREA(S) BY  TOPICAL ROUTE 2 TIMES PER DAY  omega-3 fatty acid (LOVAZA) 1 gram Oral Capsule, Take 2 Capsules (2 g total) by mouth Twice daily  omeprazole  (PRILOSEC) 40 mg Oral Capsule, Delayed Release(E.C.), Take 1 Capsule (40 mg total) by mouth Once a day Indications: bleeding from stomach, esophagus or duodenum  pioglitazone  (ACTOS ) 30 mg Oral Tablet, Take 1 Tablet (30 mg total) by mouth Once a day for 90 days  potassium gluconate 600 mg (99 mg) Oral Tablet, Take 1 Tablet by mouth Daily  rosuvastatin  (CRESTOR ) 20 mg Oral Tablet, Take 1 Tablet (20 mg total) by mouth Once a day  triamcinolone  acetonide 0.1 % Cream, Apply 1 Application topically Once per day as needed for Other    No facility-administered medications prior to visit.      Allergies:   No Known Allergies      OBJECTIVE  BP 135/79 (Site: Right Arm, Patient Position: Sitting, Cuff Size: Adult)   Pulse 79   Temp 37.1 C (98.7 F)   Wt 107 kg (235 lb)   SpO2 98%   BMI 39.11 kg/m       Physical Exam  Constitutional:       Appearance: She is obese.   Cardiovascular:      Rate and Rhythm: Normal rate and regular rhythm.      Pulses: Normal pulses.      Heart sounds: Normal heart sounds.  Pulmonary:      Effort: Pulmonary effort is normal.      Breath sounds: Normal breath sounds.   Abdominal:      General: There is no distension.      Palpations: There is no mass.      Tenderness: There is abdominal tenderness. There is no right CVA tenderness, left CVA tenderness or guarding.   Neurological:      Mental Status: She is alert. Mental status is at baseline.      Motor: Weakness present.      Gait: Gait abnormal.   Psychiatric:         Mood and Affect: Mood normal.         Behavior: Behavior normal.           ASSESSMENT/PLAN  (N39.0) UTI (urinary tract infection)  (primary encounter diagnosis)  Plan: URINE CULTURE, Clean Catch    (B37.31) Vaginal candidiasis       Problem List Items Addressed This Visit          Nephrology    UTI (urinary tract infection) - Primary     Further treatment pending urine culture.  Start Macrobid 100 mg p.o. b.i.d. times 10 days for urinary tract infection.  Advised to hydrate well with water.  Advised to avoid caffeinated beverages.  Advise patient 10 oz of cranberry juice daily.  Advised follow-up if symptoms progress or do not resolve.         Relevant Orders    URINE CULTURE, Clean Catch       Obstetrics/Gynecology    Vaginal candidiasis    Continue Diflucan as prescribed.  Advised follow-up if symptoms progress or do not resolve.           Orders Placed This Encounter    URINE CULTURE, Clean Catch    fluconazole (DIFLUCAN) 150 mg Oral Tablet    nitrofurantoin monohyd/m-cryst (MACROBID) 100 mg Oral Capsule        Return if symptoms worsen or fail to improve.    Valorie Gearing, NP-C 09/12/2023, 08:29

## 2023-09-13 ENCOUNTER — Ambulatory Visit (RURAL_HEALTH_CENTER): Payer: Self-pay | Admitting: Family

## 2023-09-13 LAB — POCT URINE DIPSTICK
BILIRUBIN: NEGATIVE
GLUCOSE: NEGATIVE
NITRITE: NEGATIVE
PH: 6
PROTEIN: 30
SPECIFIC GRAVITY: 1.015
UROBILINOGEN: 1

## 2023-09-13 NOTE — Addendum Note (Signed)
 Addended by: Dejuana Weist [place] on: 09/13/2023 07:19 AM     Modules accepted: Orders

## 2023-09-14 LAB — URINE CULTURE: URINE CULTURE: 50000 — AB

## 2023-09-17 NOTE — Result Encounter Note (Signed)
 Patient informed and voiced understanding

## 2023-09-18 ENCOUNTER — Other Ambulatory Visit: Payer: Self-pay

## 2023-09-18 ENCOUNTER — Ambulatory Visit (RURAL_HEALTH_CENTER): Payer: Self-pay | Attending: Family | Admitting: Family

## 2023-09-18 ENCOUNTER — Encounter (RURAL_HEALTH_CENTER): Payer: Self-pay | Admitting: Family

## 2023-09-18 VITALS — BP 126/61 | HR 74 | Temp 98.9°F | Wt 235.0 lb

## 2023-09-18 DIAGNOSIS — I447 Left bundle-branch block, unspecified: Secondary | ICD-10-CM

## 2023-09-18 DIAGNOSIS — Z01818 Encounter for other preprocedural examination: Secondary | ICD-10-CM | POA: Insufficient documentation

## 2023-09-18 NOTE — Progress Notes (Signed)
 [facility]  [address]  [place], [place] [address]  Phone: [phone] Fax: [phone]    Name: Amber Owens                       Date of Birth: [birth year]   MRN:  [id number]                         Date of visit: 09/18/2023     Chief Complaint: Pre-op    Current Outpatient Medications   Medication Sig    acetaminophen (TYLENOL) 500 mg Oral Tablet Take 1 Tablet (500 mg total) by mouth Every 6 hours as needed    aspirin (ECOTRIN) 81 mg Oral Tablet, Delayed Release (E.C.) Take 1 Tablet (81 mg total) by mouth Daily    calcium citrate-vitamin D3 (CITRACAL) 200 mg-6.25 mcg (250 unit) Oral Tablet Take by mouth Once a day    cholecalciferol, vitamin D3, 25 mcg (1,000 unit) Oral Tablet Take 1 Tablet (1,000 Units total) by mouth Daily    collagen/biotin/ascorbic acid (COLLAGEN 1500 PLUS C ORAL) Take 1,000 mg by mouth    cyanocobalamin  (VITAMIN B 12) 1,000 mcg Oral Tablet Take 1 Tablet (1,000 mcg total) by mouth Once a day    losartan  (COZAAR ) 25 mg Oral Tablet Take 1 Tablet (25 mg total) by mouth Once a day for blood pressure    magnesium  chloride (SLOW-MAG) 64 mg Oral Tablet, Delayed Release (E.C.) Take 1 Tablet (64 mg total) by mouth Once a day    metFORMIN  (GLUCOPHAGE ) 500 mg Oral Tablet Take 2 Tablets (1,000 mg total) by mouth Twice daily    multivit-min-mfolate-K-herb289 (ALIVE WOMEN'S 50 PLUS ULTRA) 800 mcg DFE- 150 mcg Oral Tablet Take 1 Tablet by mouth Once a day    nystatin (NYSTOP) 100,000 unit/gram Powder APPLY TO THE AFFECTED AREA(S) BY TOPICAL ROUTE 2 TIMES PER DAY    omega-3 fatty acid (LOVAZA) 1 gram Oral Capsule Take 2 Capsules (2 g total) by mouth Twice daily    omeprazole  (PRILOSEC) 40 mg Oral Capsule, Delayed Release(E.C.) Take 1 Capsule (40 mg total) by mouth Once a day Indications: bleeding from stomach, esophagus or duodenum    potassium gluconate 600 mg (99 mg) Oral Tablet Take 1 Tablet by mouth Daily    rosuvastatin  (CRESTOR ) 20 mg Oral Tablet Take 1 Tablet (20 mg total) by mouth Once a  day    triamcinolone  acetonide 0.1 % Cream Apply 1 Application topically Once per day as needed for Other       Patient Active Problem List    Diagnosis Date Noted    Cardiomyopathy 10/09/2023    LBBB (left bundle branch block) 10/09/2023    Preop examination 09/18/2023    Iron  deficiency 09/12/2023    S/P lumbar laminectomy 04/19/2023    Type 2 diabetes mellitus without complication, without long-term current use of insulin 03/05/2023    Lumbar radiculopathy 01/09/2023    Osteoarthritis of right knee 01/05/2023    Lumbar facet arthropathy 12/04/2022    Multilevel degenerative disc disease 11/14/2022    Disc-osteophyte complex 11/14/2022    Osteopenia after menopause 11/14/2022      DEXA 10/25/2022 indicates osteopenia.       Fatty liver 11/14/2022     Dr. Alyssa Backbone.      Need for shingles vaccine 10/12/2022    Postmenopausal status 10/12/2022    Gait abnormality 10/12/2022    Risk for falls 10/12/2022  Colon cancer high risk 10/12/2022    Low back pain 08/16/2022    Hypomagnesemia 06/19/2022     Rx:  Magnesium  64 mg daily.      Osteoarthritis (arthritis due to wear and tear of joints) 04/03/2022    Impaired functional mobility, balance, gait, and endurance 03/16/2022    Morbid obesity (CMS HCC) 12/12/2021    B12 deficiency 11/08/2021    Hypertensive disorder 08/01/2021     Dr. Pandora Bogaert.      Mixed hyperlipidemia 08/01/2021    Gastroesophageal reflux disease without esophagitis 08/01/2021    Environmental and seasonal allergies 08/01/2021     Advised and over-the-counter antihistamine.  Patient declined respiratory screenings.       Colon cancer screening 08/01/2021    Vitamin D  deficiency 08/01/2021     Vitamin-4000 IU daily and OTC calcium 1200 mg daily.      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  Last eye exam 12/18/20, Dr. Jenni Mody.         Subjective:   Patient is here for Pre Op Clearance    Surgery Details   Surgeon: Dr. Britta Candy, Orthopedic Surgeon   Details:  Right Total  Knee Replacement    Red Flags   New or Exacerbated medical problems: pain and limited mobility   Previous surgical intolerances: None reported.   Previous surgical complications: None reported.    Cardiovascular assessment   Active cardiac conditions: None   Current cardiac symptoms: Dyspnea with exertion,    Associated cardiac risk factors: Type 2 Diabetes, Hypertension, LBBB, Cardiomyopathy, Hyperlipidemia, Morbid Obesity.    Functional Capacity: 1 met      Pulmonary Assessment   Sleep Apnea: None   Pulmonary Risk factors: Age > 60    Renal Assessment   CKD: None   Most recent renal function: Creatinine 0.67, GFR 96.    Coagulation   ASA: Not used.   Warfarin: Not used.   NOAC: Not used.    Meds to stop before surgery: hold aspirin 1 week before surgery; hold metformin  and statin 2 days before surgery and 2 days after surgery.  Meds to take on day of surgery: BP medications.    ROS:  10 systems reviewed and were negative except as noted.       Objective :  BP 126/61 (Site: Right Arm, Patient Position: Sitting, Cuff Size: Adult Large)   Pulse 74   Temp 37.2 C (98.9 F)   Wt 107 kg (235 lb)   SpO2 98%   BMI 39.11 kg/m         Data reviewed: cbc, cmp, chest xray, and urinalysis.  Abnormal EKG, Cardiac clearance is pending.      Assessment/Plan:    ASSESSMENT/PLAN:   1. Preoperative Consultation for general surgery under local / MAC / General anesthesia. Patient has multiple risk factors for major cardiac complications by the Revised Goldman Cardiac Risk Index. This puts him/her at approximately 2-7% risk of a major cardiac complication during the planned procedure. Patient has been informed of this risk, is willing to proceed, and thus is medically cleared for the planned procedure pending cardiac clearance.   2. Med/Hgb recommendations for surgery: HGB 12.4, HCT 37.4, A1C 6.7, Albumin 4.4  3. Other issues: Dr. Pandora Bogaert, Cardiologist.      Revised Gara July Cardiac Risk Index: 10  Six independent predictors of  major cardiac complications (defined as myocardial infarction, pulmonary edema, ventricular fibrillation or primary cardiac arrest,  and complete heart block)  High-risk type of surgery (intraperitoneal, intrathoracic, or suprainguinal vascular surgery)   History of ischemic heart disease (history of MI or a positive exercise test, current complaint of chest pain considered to be secondary to myocardial ischemia, use of nitrate therapy, or ECG with pathological Q waves; do not count prior coronary revascularization procedure unless one of the other criteria for ischemic heart disease is present)   History of HF   History of cerebrovascular disease   Diabetes mellitus requiring treatment with insulin   Preoperative serum creatinine >2.0 mg/dL (161 mol/L)   These predictors were then validated in a cohort of 1422 patients. The predictive value was significant in all types of elective major noncardiac surgery except for abdominal aortic aneurysm surgery (show figure 1).  The risk of major cardiac complications varied according to the number of risk factors. If one includes only cardiac death, nonfatal MI, and nonfatal cardiac arrest as major cardiac events in the [place] cohort, the following rates of adverse outcomes were seen [7]:  No risk factors -- 0.4 percent (95% CI 0.1-0.8 percent)   One risk factor -- 1.0 percent (95% CI 0.5-1.4 percent) (Consider noninvasive cardiac testing)  Two risk factors -- 2.4 percent (95% CI 1.3-3.5 percent) (Consider noninvasive cardiac testing)  Three or more risk factors -- 5.4 percent (95% CI 2.8-7.9 percent)     Valorie Gearing, NP-C

## 2023-09-18 NOTE — Nursing Note (Signed)
 Patient here for pre-op for total right knee surgery with Dr. Britta Candy on 10/09/2023.

## 2023-09-25 ENCOUNTER — Ambulatory Visit (RURAL_HEALTH_CENTER): Payer: Self-pay | Admitting: Family

## 2023-09-25 NOTE — Telephone Encounter (Signed)
 Patient scheduled for tomorrow morning

## 2023-09-26 ENCOUNTER — Other Ambulatory Visit: Payer: Self-pay

## 2023-09-26 ENCOUNTER — Ambulatory Visit (RURAL_HEALTH_CENTER): Payer: Self-pay | Attending: Family | Admitting: Family

## 2023-09-26 ENCOUNTER — Ambulatory Visit: Attending: Family | Admitting: Family

## 2023-09-26 VITALS — BP 141/61 | HR 65 | Temp 98.7°F

## 2023-09-26 DIAGNOSIS — R809 Proteinuria, unspecified: Secondary | ICD-10-CM

## 2023-09-26 HISTORY — DX: Proteinuria, unspecified: R80.9

## 2023-09-26 LAB — POCT URINE DIPSTICK
BILIRUBIN: NEGATIVE
GLUCOSE: NEGATIVE
LEUKOCYTES: NEGATIVE
NITRITE: NEGATIVE
PH: 6.5
SPECIFIC GRAVITY: 1.015
UROBILINOGEN: 0.2

## 2023-09-26 NOTE — Progress Notes (Signed)
 [facility]INE [facility]  [facility]      ASA FATH  MRN: [id number]  DOB: 1956-03-23  Date of Service: 09/26/2023    CHIEF COMPLAINT  Chief Complaint   Patient presents with    Recurrent Urinary Tract Infections       SUBJECTIVE  Amber Owens is a 68 y.o. female who presents to clinic for follow-up on urinary tract infection.  Patient has completed 10 days of Macrobid  and follow up on yeast infection, completed 2 doses of Diflucan .  Patient denies dysuria or hematuria.  Denies suprapubic or flank pain.  Denies vaginal discharge.  Denies fever, nausea, vomiting or diarrhea.  Denies chest pain, palpitations or shortness for breath.  Denies recent hospitalizations or surgery.  Patient is planning to have knee replacement in the next few weeks.    Review of Systems:  Positive ROS discussed in HPI, otherwise all other systems negative.      Medications:   acetaminophen (TYLENOL) 500 mg Oral Tablet, Take 1 Tablet (500 mg total) by mouth Every 6 hours as needed  aspirin (ECOTRIN) 81 mg Oral Tablet, Delayed Release (E.C.), Take 1 Tablet (81 mg total) by mouth Daily  calcium citrate-vitamin D3 (CITRACAL) 200 mg-6.25 mcg (250 unit) Oral Tablet, Take by mouth Once a day  cholecalciferol, vitamin D3, 25 mcg (1,000 unit) Oral Tablet, Take 1 Tablet (1,000 Units total) by mouth Daily  collagen/biotin/ascorbic acid (COLLAGEN 1500 PLUS C ORAL), Take 1,000 mg by mouth  cyanocobalamin  (VITAMIN B 12) 1,000 mcg Oral Tablet, Take 1 Tablet (1,000 mcg total) by mouth Once a day  fluconazole  (DIFLUCAN ) 150 mg Oral Tablet, 1 tablet Po q 72 hours x 3 doses  losartan  (COZAAR ) 25 mg Oral Tablet, Take 1 Tablet (25 mg total) by mouth Once a day for blood pressure  magnesium  chloride (SLOW-MAG) 64 mg Oral Tablet, Delayed Release (E.C.), Take 1 Tablet (64 mg total) by mouth Once a day  metFORMIN  (GLUCOPHAGE ) 500 mg Oral Tablet, Take 2 Tablets (1,000 mg total) by mouth Twice daily  multivit-min-mfolate-K-herb289  (ALIVE WOMEN'S 50 PLUS ULTRA) 800 mcg DFE- 150 mcg Oral Tablet, Take 1 Tablet by mouth Once a day  nystatin (NYSTOP) 100,000 unit/gram Powder, APPLY TO THE AFFECTED AREA(S) BY TOPICAL ROUTE 2 TIMES PER DAY  omega-3 fatty acid (LOVAZA) 1 gram Oral Capsule, Take 2 Capsules (2 g total) by mouth Twice daily  omeprazole  (PRILOSEC) 40 mg Oral Capsule, Delayed Release(E.C.), Take 1 Capsule (40 mg total) by mouth Once a day Indications: bleeding from stomach, esophagus or duodenum  pioglitazone  (ACTOS ) 30 mg Oral Tablet, Take 1 Tablet (30 mg total) by mouth Once a day for 90 days (Patient not taking: Reported on 09/26/2023)  potassium gluconate 600 mg (99 mg) Oral Tablet, Take 1 Tablet by mouth Daily  rosuvastatin  (CRESTOR ) 20 mg Oral Tablet, Take 1 Tablet (20 mg total) by mouth Once a day  triamcinolone  acetonide 0.1 % Cream, Apply 1 Application topically Once per day as needed for Other  nitrofurantoin  monohyd/m-cryst (MACROBID ) 100 mg Oral Capsule, Take 1 Capsule (100 mg total) by mouth Twice daily for 10 days    No facility-administered medications prior to visit.      Allergies:   No Known Allergies      OBJECTIVE  BP (!) 141/61 (Site: Right Arm, Patient Position: Sitting, Cuff Size: Adult Large)   Pulse 65   Temp 37.1 C (98.7 F)   SpO2 97%       Physical  Exam  Constitutional:       Appearance: She is obese.   Cardiovascular:      Rate and Rhythm: Normal rate and regular rhythm.      Pulses: Normal pulses.      Heart sounds: Normal heart sounds.   Pulmonary:      Effort: Pulmonary effort is normal.      Breath sounds: Normal breath sounds.   Abdominal:      General: There is no distension.      Palpations: There is no mass.      Tenderness: There is abdominal tenderness. There is rebound. There is no right CVA tenderness, left CVA tenderness or guarding.      Hernia: No hernia is present.   Skin:     Capillary Refill: Capillary refill takes less than 2 seconds.   Neurological:      Mental Status: She is alert.       Motor: Weakness present.      Gait: Gait abnormal.           ASSESSMENT/PLAN  (R80.9) Proteinuria, unspecified type  (primary encounter diagnosis)  Plan: URINE CULTURE, Clean Catch       Problem List Items Addressed This Visit    None  Visit Diagnoses         Proteinuria, unspecified type    -  Primary    Relevant Orders    URINE CULTURE, Clean Catch           Orders Placed This Encounter    URINE CULTURE, Clean Catch        No follow-ups on file.    Valorie Gearing, NP-C 09/26/2023, 08:46

## 2023-09-26 NOTE — Addendum Note (Signed)
 Addended by: Junita Kubota [place] on: 09/26/2023 09:51 AM     Modules accepted: Orders

## 2023-09-26 NOTE — Nursing Note (Signed)
 Patient here for urine recheck for UTI. Patient complains of redness, irritation, and itching in vaginal area

## 2023-09-26 NOTE — Assessment & Plan Note (Signed)
 History of contaminated urine.  Recheck urine culture today.  Advised to hydrate well.  Advised to avoid caffeinated/sugary drinks.  Advised to follow-up if symptoms progress or do not resolve

## 2023-09-28 ENCOUNTER — Ambulatory Visit (HOSPITAL_BASED_OUTPATIENT_CLINIC_OR_DEPARTMENT_OTHER)
Admission: RE | Admit: 2023-09-28 | Discharge: 2023-09-28 | Disposition: A | Discharge status: Home / Self Care | Source: Ambulatory Visit | Attending: Family | Admitting: Family

## 2023-09-28 ENCOUNTER — Ambulatory Visit (HOSPITAL_COMMUNITY): Attending: Orthopaedic Surgery

## 2023-09-28 ENCOUNTER — Other Ambulatory Visit: Payer: Self-pay

## 2023-09-28 DIAGNOSIS — Z01818 Encounter for other preprocedural examination: Secondary | ICD-10-CM | POA: Insufficient documentation

## 2023-09-28 DIAGNOSIS — M47814 Spondylosis without myelopathy or radiculopathy, thoracic region: Secondary | ICD-10-CM

## 2023-09-28 DIAGNOSIS — Z131 Encounter for screening for diabetes mellitus: Secondary | ICD-10-CM | POA: Insufficient documentation

## 2023-09-28 DIAGNOSIS — Z01811 Encounter for preprocedural respiratory examination: Secondary | ICD-10-CM

## 2023-09-28 LAB — URINALYSIS, MACRO/MICRO
BILIRUBIN: NEGATIVE mg/dL
BLOOD: NEGATIVE mg/dL
GLUCOSE: NEGATIVE mg/dL
KETONES: NEGATIVE mg/dL
LEUKOCYTES: NEGATIVE WBCs/uL
NITRITE: NEGATIVE
PH: 5.5 (ref 5.0–9.0)
PROTEIN: 20 mg/dL
SPECIFIC GRAVITY: 1.024 (ref 1.002–1.030)
UROBILINOGEN: NORMAL mg/dL

## 2023-09-28 LAB — CBC WITH DIFF
BASOPHIL #: 0 10*3/uL (ref 0.00–0.10)
BASOPHIL %: 0 % (ref 0–1)
EOSINOPHIL #: 0.1 10*3/uL (ref 0.00–0.50)
EOSINOPHIL %: 1 % (ref 1–7)
HCT: 37.4 % (ref 31.2–41.9)
HGB: 12.4 g/dL (ref 10.9–14.3)
LYMPHOCYTE #: 1.4 10*3/uL (ref 1.10–3.10)
LYMPHOCYTE %: 19 % (ref 16–46)
MCH: 25.8 pg (ref 24.7–32.8)
MCHC: 33.2 g/dL (ref 32.3–35.6)
MCV: 77.6 fL (ref 75.5–95.3)
MONOCYTE #: 0.5 10*3/uL (ref 0.20–0.90)
MONOCYTE %: 6 % (ref 4–11)
MPV: 8.6 fL (ref 7.9–10.8)
NEUTROPHIL #: 5.4 10*3/uL (ref 1.90–8.20)
NEUTROPHIL %: 73 % (ref 43–77)
PLATELETS: 175 10*3/uL (ref 140–440)
RBC: 4.81 10*6/uL (ref 3.63–4.92)
RDW: 17.4 % (ref 12.3–17.7)
WBC: 7.4 10*3/uL (ref 3.8–11.8)

## 2023-09-28 LAB — COMPREHENSIVE METABOLIC PANEL, NON-FASTING
ALBUMIN/GLOBULIN RATIO: 1.7 — ABNORMAL HIGH (ref 0.8–1.4)
ALBUMIN: 4.4 g/dL (ref 3.5–5.7)
ALKALINE PHOSPHATASE: 66 U/L (ref 34–104)
ALT (SGPT): 13 U/L (ref 7–52)
ANION GAP: 10 mmol/L (ref 4–13)
AST (SGOT): 17 U/L (ref 13–39)
BILIRUBIN TOTAL: 0.4 mg/dL (ref 0.3–1.0)
BUN/CREA RATIO: 27 — ABNORMAL HIGH (ref 6–22)
BUN: 18 mg/dL (ref 7–25)
CALCIUM, CORRECTED: 8.8 mg/dL — ABNORMAL LOW (ref 8.9–10.8)
CALCIUM: 9.1 mg/dL (ref 8.6–10.3)
CHLORIDE: 110 mmol/L — ABNORMAL HIGH (ref 98–107)
CO2 TOTAL: 21 mmol/L (ref 21–31)
CREATININE: 0.67 mg/dL (ref 0.60–1.30)
ESTIMATED GFR: 96 mL/min/{1.73_m2} (ref >59)
GLOBULIN: 2.6 (ref 2.0–3.5)
GLUCOSE: 114 mg/dL — ABNORMAL HIGH (ref 74–109)
OSMOLALITY, CALCULATED: 284 mosm/kg (ref 270–290)
POTASSIUM: 4.5 mmol/L (ref 3.5–5.1)
PROTEIN TOTAL: 7 g/dL (ref 6.4–8.9)
SODIUM: 141 mmol/L (ref 136–145)

## 2023-09-28 LAB — URINALYSIS, MICROSCOPIC
RBCS: 4 /HPF — ABNORMAL HIGH (ref <4)
SQUAMOUS EPITHELIAL: 1 /HPF (ref <28)
WBCS: 3 /HPF (ref <6)

## 2023-09-28 LAB — URINE CULTURE: URINE CULTURE: 50000 — AB

## 2023-09-29 LAB — HGA1C (HEMOGLOBIN A1C WITH EST AVG GLUCOSE): HEMOGLOBIN A1C: 6.7 % — ABNORMAL HIGH (ref 4.0–6.0)

## 2023-10-01 ENCOUNTER — Ambulatory Visit (RURAL_HEALTH_CENTER): Payer: Self-pay | Admitting: Family

## 2023-10-01 NOTE — Result Encounter Note (Signed)
 Left message to call back

## 2023-10-01 NOTE — Result Encounter Note (Signed)
 Patient informed and voiced understanding

## 2023-10-03 ENCOUNTER — Other Ambulatory Visit: Payer: Self-pay

## 2023-10-03 ENCOUNTER — Encounter (RURAL_HEALTH_CENTER): Payer: Self-pay | Admitting: Family

## 2023-10-03 ENCOUNTER — Ambulatory Visit (RURAL_HEALTH_CENTER): Payer: Self-pay | Attending: Family | Admitting: Family

## 2023-10-03 VITALS — BP 137/55 | HR 64 | Temp 98.7°F | Wt 237.0 lb

## 2023-10-03 DIAGNOSIS — Z7984 Long term (current) use of oral hypoglycemic drugs: Secondary | ICD-10-CM | POA: Insufficient documentation

## 2023-10-03 DIAGNOSIS — Z6839 Body mass index (BMI) 39.0-39.9, adult: Secondary | ICD-10-CM | POA: Insufficient documentation

## 2023-10-03 DIAGNOSIS — E559 Vitamin D deficiency, unspecified: Secondary | ICD-10-CM | POA: Insufficient documentation

## 2023-10-03 DIAGNOSIS — E782 Mixed hyperlipidemia: Secondary | ICD-10-CM | POA: Insufficient documentation

## 2023-10-03 DIAGNOSIS — E119 Type 2 diabetes mellitus without complications: Secondary | ICD-10-CM | POA: Insufficient documentation

## 2023-10-03 DIAGNOSIS — J3089 Other allergic rhinitis: Secondary | ICD-10-CM | POA: Insufficient documentation

## 2023-10-03 DIAGNOSIS — R269 Unspecified abnormalities of gait and mobility: Secondary | ICD-10-CM | POA: Insufficient documentation

## 2023-10-03 DIAGNOSIS — M160 Bilateral primary osteoarthritis of hip: Secondary | ICD-10-CM | POA: Insufficient documentation

## 2023-10-03 DIAGNOSIS — M1990 Unspecified osteoarthritis, unspecified site: Secondary | ICD-10-CM | POA: Insufficient documentation

## 2023-10-03 DIAGNOSIS — M170 Bilateral primary osteoarthritis of knee: Secondary | ICD-10-CM | POA: Insufficient documentation

## 2023-10-03 DIAGNOSIS — E611 Iron deficiency: Secondary | ICD-10-CM | POA: Insufficient documentation

## 2023-10-03 DIAGNOSIS — E538 Deficiency of other specified B group vitamins: Secondary | ICD-10-CM | POA: Insufficient documentation

## 2023-10-03 DIAGNOSIS — I10 Essential (primary) hypertension: Secondary | ICD-10-CM | POA: Insufficient documentation

## 2023-10-03 DIAGNOSIS — K219 Gastro-esophageal reflux disease without esophagitis: Secondary | ICD-10-CM | POA: Insufficient documentation

## 2023-10-03 DIAGNOSIS — R531 Weakness: Secondary | ICD-10-CM | POA: Insufficient documentation

## 2023-10-03 DIAGNOSIS — E1142 Type 2 diabetes mellitus with diabetic polyneuropathy: Secondary | ICD-10-CM | POA: Insufficient documentation

## 2023-10-03 DIAGNOSIS — Z79899 Other long term (current) drug therapy: Secondary | ICD-10-CM | POA: Insufficient documentation

## 2023-10-03 DIAGNOSIS — Z7409 Other reduced mobility: Secondary | ICD-10-CM | POA: Insufficient documentation

## 2023-10-03 NOTE — Assessment & Plan Note (Signed)
 Vitamin B12 697 - Continue vitamin B12 1000 mcg daily.

## 2023-10-03 NOTE — Assessment & Plan Note (Addendum)
 BP elevated today.  Patient is asymptomatic.  Continue current medications.  Avoid OTC NSAID'S.  Limit sodium in diet.

## 2023-10-03 NOTE — Assessment & Plan Note (Signed)
 Discussed fall risk.  Denies history of falls.  Patient will continue activity as recommended by [facility] Orthopedic Surgeon

## 2023-10-03 NOTE — Nursing Note (Signed)
 Patient here for follow up with no new complaints

## 2023-10-03 NOTE — Assessment & Plan Note (Addendum)
 Total cholesterol 156, triglycerides 160, HDL 46, LDL 82 at last visit.  Further treatment pending labs. Continue Lovaza and Crestor  20 mg daily.  Advised to limit high fat foods and processed foods in diet.

## 2023-10-03 NOTE — Assessment & Plan Note (Signed)
 Vitamin D 63 - Continue OTC vitamin D 4000 IU daily.

## 2023-10-03 NOTE — Progress Notes (Signed)
 FAMILY MEDICINE, [facility] FAMILY MEDICINE [facility]  [address]  [place] [address]  Operated by [facility]     Name: Amber Owens MRN:  [id number]   Date of Birth: [birth year] Age: 68 y.o.   Date: 10/03/2023  Time: 16:37     Provider: Valorie Gearing, NP-C    Reason for visit: Follow Up 3 Months      History of Present Illness:  Amber Owens is a 68 y.o. female presenting with chronic disease management.  Reports compliant with medications.    Patient reports her orthopedic surgery is on hold due to abnormal EKG and scheduled cardiac catheterization.  Patient is in a wheelchair at home but reports she is walking more at home. Patient reports she is under the care of Dr. 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  Patient is under the care of of Dr. Modena Andes at [facility].        Patient Active Problem List    Diagnosis Date Noted    Preop examination 09/18/2023    Iron  deficiency 09/12/2023    Vaginal candidiasis 09/11/2023    S/P lumbar laminectomy 04/19/2023    Type 2 diabetes mellitus without complication, without long-term current use of insulin 03/05/2023    Osteoarthritis of right knee 01/05/2023    Lumbar facet arthropathy 12/04/2022    Multilevel degenerative disc disease 11/14/2022    Disc-osteophyte complex 11/14/2022    Osteopenia after menopause 11/14/2022      DEXA 10/25/2022 indicates osteopenia.       Fatty liver 11/14/2022     Dr. Alyssa Backbone.      Need for shingles vaccine 10/12/2022    Postmenopausal status 10/12/2022    Gait abnormality 10/12/2022    Risk for falls 10/12/2022    Colon cancer high risk 10/12/2022    Low back pain 08/16/2022    Hypomagnesemia 06/19/2022     Rx:  Magnesium  64 mg daily.      Osteoarthritis (arthritis due to wear and tear of joints) 04/03/2022    Impaired functional mobility, balance, gait, and endurance 03/16/2022    Morbid obesity (CMS HCC) 12/12/2021    B12 deficiency 11/08/2021    Hypertensive disorder 08/01/2021      Dr. Pandora Bogaert.      Mixed hyperlipidemia 08/01/2021    Gastroesophageal reflux disease without esophagitis 08/01/2021    Environmental and seasonal allergies 08/01/2021     Advised and over-the-counter antihistamine.  Patient declined respiratory screenings.       Colon cancer screening 08/01/2021    Vitamin D  deficiency 08/01/2021     Vitamin-4000 IU daily and OTC calcium 1200 mg daily.      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  Last eye exam 12/18/20, Dr. Jenni Mody.         Historical Data    Past Medical History:  Past Medical History:   Diagnosis Date    Abdominal pain     Acute cystitis with hematuria 10/03/2022    Anxiety     Anxiety 08/01/2021    B12 deficiency     Chronic back pain     Chronic constipation 06/27/2018    Chronic left hip pain     Chronic right lower quadrant pain 10/03/2022    COVID     Cystocele with rectocele     Diabetes mellitus, type 2     Diabetic neuropathy, type  II diabetes mellitus     Encounter for screening for malignant neoplasm of breast     Esophageal reflux     Female bladder prolapse     Hypercholesterolemia     Hypertension     Intermittent claudication (CMS HCC)     Iron  deficiency anemia 06/19/2022    Leukocytes in urine 08/16/2022    Morbid obesity with BMI of 40.0-44.9, adult (CMS HCC)     OAB (overactive bladder) 06/27/2018    Osteoarthritis of spinal facet joint     Proteinuria 09/26/2023    Steatosis of liver     UTI (urinary tract infection) 09/11/2023    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 Retha Cast- [facility]    REPAIR RECTOCELE  2023    Dr. Dorethia Ganser     Allergies:  No Known Allergies  Medications:  Current  Outpatient Medications   Medication Sig    acetaminophen (TYLENOL) 500 mg Oral Tablet Take 1 Tablet (500 mg total) by mouth Every 6 hours as needed    aspirin (ECOTRIN) 81 mg Oral Tablet, Delayed Release (E.C.) Take 1 Tablet (81 mg total) by mouth Daily    calcium citrate-vitamin D3 (CITRACAL) 200 mg-6.25 mcg (250 unit) Oral Tablet Take by mouth Once a day    cholecalciferol, vitamin D3, 25 mcg (1,000 unit) Oral Tablet Take 1 Tablet (1,000 Units total) by mouth Daily    collagen/biotin/ascorbic acid (COLLAGEN 1500 PLUS C ORAL) Take 1,000 mg by mouth    cyanocobalamin  (VITAMIN B 12) 1,000 mcg Oral Tablet Take 1 Tablet (1,000 mcg total) by mouth Once a day    losartan  (COZAAR ) 25 mg Oral Tablet Take 1 Tablet (25 mg total) by mouth Once a day for blood pressure    magnesium  chloride (SLOW-MAG) 64 mg Oral Tablet, Delayed Release (E.C.) Take 1 Tablet (64 mg total) by mouth Once a day    metFORMIN  (GLUCOPHAGE ) 500 mg Oral Tablet Take 2 Tablets (1,000 mg total) by mouth Twice daily    multivit-min-mfolate-[id number]-herb289 (ALIVE WOMEN'S 50 PLUS ULTRA) 800 mcg DFE- 150 mcg Oral Tablet Take 1 Tablet by mouth Once a day    nystatin (NYSTOP) 100,000 unit/gram Powder APPLY TO THE AFFECTED AREA(S) BY TOPICAL ROUTE 2 TIMES PER DAY    omega-3 fatty acid (LOVAZA) 1 gram Oral Capsule Take 2 Capsules (2 g total) by mouth Twice daily    omeprazole  (PRILOSEC) 40 mg Oral Capsule, Delayed Release(E.C.) Take 1 Capsule (40 mg total) by mouth Once a day Indications: bleeding from stomach, esophagus or duodenum    potassium gluconate 600 mg (99 mg) Oral Tablet Take 1 Tablet by mouth Daily    rosuvastatin  (CRESTOR ) 20 mg Oral Tablet Take 1 Tablet (20 mg total) by mouth Once a day    triamcinolone  acetonide 0.1 % Cream Apply 1 Application topically Once per day as needed for Other     Family History:  Family Medical History:       Problem Relation (Age of Onset)    Breast Cancer Maternal Aunt    No Known  Problems Father, Sister, Brother, Maternal  Grandmother, Maternal Grandfather, Paternal Grandmother, Paternal Grandfather, Daughter, Son, Maternal Uncle, Paternal Aunt, Paternal Uncle, Other    Stomach Cancer Mother            Social History:  Social History     Socioeconomic History    Marital status: Married     Spouse name: Betsey Sossamon    Number of children: 1    Highest education level: 12th grade   Tobacco Use    Smoking status: Never    Smokeless tobacco: Never   Substance and Sexual Activity    Alcohol use: Never    Drug use: Never    Sexual activity: Not Currently     Partners: Male     Social Determinants of Health     Financial Resource Strain: Low Risk  (11/14/2022)    Financial Resource Strain     SDOH Financial: No   Transportation Needs: Low Risk  (11/14/2022)    Transportation Needs     SDOH Transportation: No   Social Connections: Low Risk  (11/14/2022)    Social Connections     SDOH Social Isolation: 5 or more times a week   Intimate Partner Violence: Low Risk  (11/14/2022)    Intimate Partner Violence     SDOH Domestic Violence: No   Recent Concern: Intimate Partner Violence - High Risk (08/16/2022)    Intimate Partner Violence     SDOH Domestic Violence: No   Housing Stability: Low Risk  (11/14/2022)    Housing Stability     SDOH Housing Situation: I have housing.     SDOH Housing Worry: No           Review of Systems:  Any pertinent Review of Systems as addressed in the HPI above.    Physical Exam:  Vital Signs:  Vitals:    10/03/23 1348   BP: (!) 137/55   Pulse: 64   Temp: 37.1 C (98.7 F)   SpO2: 97%   Weight: 108 kg (237 lb)         Physical Exam  Vitals reviewed.   Constitutional:       Appearance: She is morbidly obese.   Cardiovascular:      Rate and Rhythm: Normal rate and regular rhythm.      Pulses: Normal pulses.      Heart sounds: Normal heart sounds, S1 normal and S2 normal.   Pulmonary:      Effort: Pulmonary effort is normal.      Breath sounds: Normal breath sounds.   Abdominal:      General: Bowel sounds are normal.       Palpations: Abdomen is soft.   Musculoskeletal:      Cervical back: Neck supple.      Right lower leg: No edema.      Left lower leg: No edema.   Skin:     General: Skin is warm.   Neurological:      General: No focal deficit present.      Mental Status: She is alert and oriented to person, place, and time.      Cranial Nerves: Cranial nerves 2-12 are intact.      Motor: Weakness present.      Coordination: Coordination abnormal.      Gait: Gait abnormal (in a wheelchair today; standing with assistance).   Psychiatric:         Mood and Affect: Mood normal.  Behavior: Behavior normal. Behavior is cooperative.         Thought Content: Thought content normal.         Cognition and Memory: Cognition normal.         Judgment: Judgment normal.          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)  (primary encounter diagnosis)  Plan: MICROALBUMIN/CREATININE RATIO, URINE, RANDOM,         LIPID PANEL, MAGNESIUM , THYROID  STIMULATING         HORMONE (SENSITIVE TSH), VITAMIN D  25 TOTAL,         VITAMIN B12    (I10) Hypertensive disorder  Plan: MICROALBUMIN/CREATININE RATIO, URINE, RANDOM,         LIPID PANEL, MAGNESIUM , THYROID  STIMULATING         HORMONE (SENSITIVE TSH), VITAMIN D  25 TOTAL,         VITAMIN B12    (E78.2) Mixed hyperlipidemia  Plan: MICROALBUMIN/CREATININE RATIO, URINE, RANDOM,         LIPID PANEL, MAGNESIUM , THYROID  STIMULATING         HORMONE (SENSITIVE TSH), VITAMIN D  25 TOTAL,         VITAMIN B12    (K21.9) Gastroesophageal reflux disease without esophagitis  Plan: MICROALBUMIN/CREATININE RATIO, URINE, RANDOM,         LIPID PANEL, MAGNESIUM , THYROID  STIMULATING         HORMONE (SENSITIVE TSH), VITAMIN D  25 TOTAL,         VITAMIN B12    (E53.8) B12 deficiency  Plan: MICROALBUMIN/CREATININE RATIO, URINE, RANDOM,         LIPID PANEL, MAGNESIUM , THYROID  STIMULATING         HORMONE (SENSITIVE TSH), VITAMIN D  25 TOTAL,         VITAMIN B12    (E55.9) Vitamin D   deficiency  Plan: MICROALBUMIN/CREATININE RATIO, URINE, RANDOM,         LIPID PANEL, MAGNESIUM , THYROID  STIMULATING         HORMONE (SENSITIVE TSH), VITAMIN D  25 TOTAL,         VITAMIN B12    (Z74.09) Impaired functional mobility, balance, gait, and endurance  Plan: MICROALBUMIN/CREATININE RATIO, URINE, RANDOM,         LIPID PANEL, MAGNESIUM , THYROID  STIMULATING         HORMONE (SENSITIVE TSH), VITAMIN D  25 TOTAL,         VITAMIN B12    (E66.01) Morbid obesity (CMS HCC)  Plan: MICROALBUMIN/CREATININE RATIO, URINE, RANDOM,         LIPID PANEL, MAGNESIUM , THYROID  STIMULATING         HORMONE (SENSITIVE TSH), VITAMIN D  25 TOTAL,         VITAMIN B12    (E61.1) Iron  deficiency    (E11.9) Type 2 diabetes mellitus without complication, without long-term current use of insulin    (M19.90) Osteoarthritis (arthritis due to wear and tear of joints)    (J30.89) Environmental and seasonal allergies    Problem List Items Addressed This Visit          Cardiovascular System    Hypertensive disorder    BP elevated today.  Patient is asymptomatic.  Continue current medications.  Avoid OTC NSAID'S.  Limit sodium in diet.         Relevant Orders    MICROALBUMIN/CREATININE RATIO, URINE, RANDOM    LIPID PANEL    MAGNESIUM     THYROID  STIMULATING HORMONE (SENSITIVE TSH)  VITAMIN D  25 TOTAL    VITAMIN B12    Mixed hyperlipidemia    Total cholesterol 156, triglycerides 160, HDL 46, LDL 82 at last visit.  Further treatment pending labs. Continue Lovaza and Crestor  20 mg daily.  Advised to limit high fat foods and processed foods in diet.         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 Aaron Aas  Continue current medications.  Patient reports she has been sedentary and has not been eating well due to chronic  pain.  Reports she wants to try and work on diet and physical activity as well as weight loss in the next 3 months.  Declines medication changes at this time.    Advised a diabetic diet and advised 20-30 minutes of daily physical activity.         Relevant Orders    MICROALBUMIN/CREATININE RATIO, URINE, RANDOM    LIPID PANEL    MAGNESIUM     THYROID  STIMULATING HORMONE (SENSITIVE TSH)    VITAMIN D  25 TOTAL    VITAMIN B12       Digestive    Gastroesophageal reflux disease without esophagitis    AST 20, ALT 19. Managed by Dr. Alyssa Backbone.           Relevant Orders    MICROALBUMIN/CREATININE RATIO, URINE, RANDOM    LIPID PANEL    MAGNESIUM     THYROID  STIMULATING HORMONE (SENSITIVE TSH)    VITAMIN D  25 TOTAL    VITAMIN B12       Endocrine    Vitamin D  deficiency    Vitamin D  63 - Continue OTC vitamin D  4000 IU daily.         Relevant Orders    MICROALBUMIN/CREATININE RATIO, URINE, RANDOM    LIPID PANEL    MAGNESIUM     THYROID  STIMULATING HORMONE (SENSITIVE TSH)    VITAMIN D  25 TOTAL    VITAMIN B12    B12 deficiency    Vitamin B12 697 - Continue vitamin B12 1000 mcg daily.         Relevant Orders    MICROALBUMIN/CREATININE RATIO, URINE, RANDOM    LIPID PANEL    MAGNESIUM     THYROID  STIMULATING HORMONE (SENSITIVE TSH)    VITAMIN D  25 TOTAL    VITAMIN B12    Iron  deficiency    Further treatment pending labs.  Advise an iron  rick diet.         Type 2 diabetes mellitus without complication, without long-term current use of insulin    Further treatment pending labs.  Advise a diabetic diet, advise to limit simple carbs in diet.  Encourage 20 minutes of daily daily physical activity.  Advise annual diabetic eye exams.  Advise annual diabetic foot exams.               Musculoskeletal    Osteoarthritis (arthritis due to wear and tear of joints)    Declined PT.  Advised over-the-counter Tylenol Arthritis as needed.  Managed by Dr. Britta Candy.            Other    Environmental and seasonal allergies    Hydrate well.  Continue OTC  antihistamine PRN.  Advise to follow up if symptoms progress or do not resolve.          Morbid obesity (CMS HCC)    Advised a low-fat/low sodium diet, advised 150 minutes of scheduled weekly physical activity as tolerated.  Advised to maintain a healthy weight.           Relevant Orders    MICROALBUMIN/CREATININE RATIO, URINE, RANDOM    LIPID PANEL    MAGNESIUM     THYROID  STIMULATING HORMONE (SENSITIVE TSH)    VITAMIN D  25 TOTAL    VITAMIN B12    Impaired functional mobility, balance, gait, and endurance    Discussed fall risk.  Denies history of falls.  Patient will continue activity as recommended by [facility] Orthopedic Surgeon         Relevant Orders    MICROALBUMIN/CREATININE RATIO, URINE, RANDOM    LIPID PANEL    MAGNESIUM     THYROID  STIMULATING HORMONE (SENSITIVE TSH)    VITAMIN D  25 TOTAL    VITAMIN B12            Tobacco cessation counseling not performed due to nonsmoker.     Further treatment pending routine labs ordered today.  Continue current medications as prescribed. Advised to follow up with specialists as scheduled.   Advised a low-fat/low sodium diet, advised 150 minutes of scheduled weekly physical activity as tolerated.  Advised to maintain a healthy weight.     Return in about 3 months (around 01/03/2024) for routine visit.    Valorie Gearing, NP-C     Portions of this note may be dictated using voice recognition software or a dictation service. Variances in spelling and vocabulary are possible and unintentional. Not all errors are caught/corrected. Please notify the [facility] if any discrepancies are noted or if the meaning of any statement is not clear.

## 2023-10-03 NOTE — Assessment & Plan Note (Addendum)
 A1c 7.0,  06/2023. Amber Owens  Continue current medications.  Patient reports she has been sedentary and has not been eating well due to chronic pain.  Reports she wants to try and work on diet and physical activity as well as weight loss in the next 3 months.  Declines medication changes at this time.    Advised a diabetic diet and advised 20-30 minutes of daily physical activity.

## 2023-10-03 NOTE — Assessment & Plan Note (Signed)
 Advised a low-fat/low sodium diet, advised 150 minutes of scheduled weekly physical activity as tolerated.  Advised to maintain a healthy weight.

## 2023-10-03 NOTE — Assessment & Plan Note (Signed)
 AST 20, ALT 19. Managed by Dr. Concha Se.

## 2023-10-06 ENCOUNTER — Encounter (RURAL_HEALTH_CENTER): Payer: Self-pay | Admitting: Family

## 2023-10-06 NOTE — Assessment & Plan Note (Signed)
 Declined PT.  Advised over-the-counter Tylenol Arthritis as needed.  Managed by Dr. Britta Candy.

## 2023-10-06 NOTE — Assessment & Plan Note (Signed)
 Further treatment pending labs.  Advise a diabetic diet, advise to limit simple carbs in diet.  Encourage 20 minutes of daily daily physical activity.  Advise annual diabetic eye exams.  Advise annual diabetic foot exams.

## 2023-10-06 NOTE — Assessment & Plan Note (Signed)
 Hydrate well.  Continue OTC antihistamine PRN.  Advise to follow up if symptoms progress or do not resolve.

## 2023-10-06 NOTE — Assessment & Plan Note (Signed)
 Further treatment pending labs.  Advise an iron  rick diet.

## 2023-10-08 NOTE — Result Encounter Note (Signed)
 Request faxed to DR. Javed.  Confirmation scanned into media.

## 2023-10-09 ENCOUNTER — Encounter (HOSPITAL_COMMUNITY): Admission: RE | Payer: Self-pay | Source: Ambulatory Visit

## 2023-10-09 ENCOUNTER — Other Ambulatory Visit (RURAL_HEALTH_CENTER): Payer: Self-pay | Admitting: Family

## 2023-10-09 ENCOUNTER — Inpatient Hospital Stay: Admission: RE | Admit: 2023-10-09 | Payer: Self-pay | Source: Ambulatory Visit | Admitting: Orthopaedic Surgery

## 2023-10-09 DIAGNOSIS — R001 Bradycardia, unspecified: Secondary | ICD-10-CM

## 2023-10-09 DIAGNOSIS — I429 Cardiomyopathy, unspecified: Secondary | ICD-10-CM | POA: Insufficient documentation

## 2023-10-09 DIAGNOSIS — I25118 Atherosclerotic heart disease of native coronary artery with other forms of angina pectoris: Secondary | ICD-10-CM | POA: Insufficient documentation

## 2023-10-09 DIAGNOSIS — I6523 Occlusion and stenosis of bilateral carotid arteries: Secondary | ICD-10-CM

## 2023-10-09 DIAGNOSIS — I447 Left bundle-branch block, unspecified: Secondary | ICD-10-CM | POA: Insufficient documentation

## 2023-10-09 DIAGNOSIS — I422 Other hypertrophic cardiomyopathy: Secondary | ICD-10-CM

## 2023-10-09 SURGERY — ARTHOPLASTY KNEE TOTAL MAKO ROBOTIC ASSISTED
Anesthesia: Other | Site: Knee | Laterality: Right

## 2023-10-16 ENCOUNTER — Ambulatory Visit (RURAL_HEALTH_CENTER): Payer: Self-pay | Admitting: Family

## 2023-10-19 DIAGNOSIS — R9439 Abnormal result of other cardiovascular function study: Secondary | ICD-10-CM | POA: Insufficient documentation

## 2023-10-19 HISTORY — PX: HX HEART CATHETERIZATION: SHX148

## 2023-10-22 ENCOUNTER — Ambulatory Visit (RURAL_HEALTH_CENTER): Attending: Family | Admitting: Family

## 2023-10-22 ENCOUNTER — Encounter (RURAL_HEALTH_CENTER): Payer: Self-pay | Admitting: Family

## 2023-10-22 ENCOUNTER — Other Ambulatory Visit: Payer: Self-pay

## 2023-10-22 DIAGNOSIS — Z780 Asymptomatic menopausal state: Secondary | ICD-10-CM | POA: Insufficient documentation

## 2023-10-22 DIAGNOSIS — R269 Unspecified abnormalities of gait and mobility: Secondary | ICD-10-CM | POA: Insufficient documentation

## 2023-10-22 DIAGNOSIS — Z23 Encounter for immunization: Secondary | ICD-10-CM | POA: Insufficient documentation

## 2023-10-22 DIAGNOSIS — Z9189 Other specified personal risk factors, not elsewhere classified: Secondary | ICD-10-CM | POA: Insufficient documentation

## 2023-10-22 DIAGNOSIS — Z0000 Encounter for general adult medical examination without abnormal findings: Secondary | ICD-10-CM | POA: Insufficient documentation

## 2023-10-22 DIAGNOSIS — M6281 Muscle weakness (generalized): Secondary | ICD-10-CM | POA: Insufficient documentation

## 2023-10-22 DIAGNOSIS — Z1239 Encounter for other screening for malignant neoplasm of breast: Secondary | ICD-10-CM

## 2023-10-22 DIAGNOSIS — Z1231 Encounter for screening mammogram for malignant neoplasm of breast: Secondary | ICD-10-CM | POA: Insufficient documentation

## 2023-10-22 DIAGNOSIS — Z9181 History of falling: Secondary | ICD-10-CM | POA: Insufficient documentation

## 2023-10-22 NOTE — Nursing Note (Signed)
 Called patient for medicare wellness visit

## 2023-10-27 ENCOUNTER — Encounter (RURAL_HEALTH_CENTER): Payer: Self-pay | Admitting: Family

## 2023-10-27 DIAGNOSIS — Z1231 Encounter for screening mammogram for malignant neoplasm of breast: Secondary | ICD-10-CM | POA: Insufficient documentation

## 2023-10-27 DIAGNOSIS — Z0000 Encounter for general adult medical examination without abnormal findings: Secondary | ICD-10-CM | POA: Insufficient documentation

## 2023-10-27 DIAGNOSIS — M6281 Muscle weakness (generalized): Secondary | ICD-10-CM | POA: Insufficient documentation

## 2023-10-27 DIAGNOSIS — Z1239 Encounter for other screening for malignant neoplasm of breast: Secondary | ICD-10-CM | POA: Insufficient documentation

## 2023-10-27 MED ORDER — INV PNEUMOCOCCAL 13-VAL CONJ VACCINE-DIP CRM (PF) 0.5 ML IM SYRINGE
0.5000 mL | INJECTION | Freq: Once | INTRAMUSCULAR | 0 refills | Status: AC
Start: 2023-10-27 — End: 2023-10-27

## 2023-10-27 MED ORDER — SHINGRIX (PF) 50 MCG/0.5 ML INTRAMUSCULAR SUSPENSION, KIT
0.5000 mL | INHALATION_SUSPENSION | Freq: Once | INTRAMUSCULAR | 0 refills | Status: AC
Start: 2023-10-27 — End: 2023-10-27

## 2023-10-27 NOTE — Progress Notes (Signed)
 FAMILY MEDICINE, [facility] FAMILY MEDICINE [facility]  [address]  [place] [address]  Operated by [facility]  Medicare Annual Wellness Visit    Name: Amber Owens MRN:  [id number]   Date: 10/22/2023 Age: 68 y.o.       SUBJECTIVE:   Amber Owens is a 68 y.o. female for presenting for Medicare Wellness exam.   I have reviewed and reconciled the medication list with the patient today.        10/22/2023     1:10 PM 10/12/2022     1:38 PM   Comprehensive Health Assessment-Adult   Do you wish to complete this form? Yes Yes   During the past 4 weeks, how would you rate your health in general? Fair Poor   During the past 4 weeks, how much difficulty have you had doing your usual activities inside and outside your home because of medical or emotional problems? Much difficulty Much difficulty   During the past 4 weeks, was someone available to help you if you needed and wanted help? Yes, as much as I wanted Yes, as much as I wanted   In the past year, how many times have you gone to the emergency department or been admitted to a hospital for a health problem? None None   Are you generally satisfied with your sleep? Yes No   Do you have enough money to buy things you need in everyday life, such as food, clothing, medicines, and housing? Yes, always Yes, always   Can you get to places beyond walking distance without help?  (For example, can you drive your own car or travel alone on buses)? Yes Yes   Do you fasten your seatbelt when you are in a car? Yes, usually Yes, usually   Do you exercise 20 minutes 3 or more days per week (such as walking, dancing, biking, mowing grass, swimming)? No, I usually don't exercise this much Yes, most of the time   How often do you eat food that is healthy (fruits, vegetables, lean meats) instead of unhealthy (sweets, fast food, junk food, fatty foods)? Some of the time Some of the time   Have your parents, brothers or sisters had any of the following  problems before the age of 58? (check all that apply) Heart problems, or hardening of the arteries;Mental health problems such as depression, bipolar, severe anxiety, postpartum depression;Alcohol or drug addiction (or abuse) Heart problems, or hardening of the arteries;Diabetes (sugar);Cancer;Alcohol or drug addiction (or abuse)   How often do you have trouble taking medicines the eay you are told to take them? I always take them as prescribed I always take them as prescribed   Do you need any help communicating with your doctors and nurses because of vision or hearing problems? No No   During the past 12 months, have you experienced confusion or memory loss that is happening more often or is getting worse? No    Do you have one person you think of as your personal doctor (primary care provider or family doctor)? Yes Yes   If you are seeing a Primary Care Provider (PCP) or family doctor. please list their name Donny Dorsey Donny Dorsey   Are you now also seeing any specialist physician(s) (such as eye doctor, foot doctor, skin doctor)? Yes Yes   If you are seeing a specialist for anything such as foot, eye, skin, etc.  please list their name(s) See Care Team ortho  How confident are you that you can control or manage most of your health problems? Very confident Somewhat confident       I have reviewed and updated as appropriate the past medical, family and social history. 10/22/2023 as summarized below:  Past Medical History:   Diagnosis Date    Abdominal pain     Acute cystitis with hematuria 10/03/2022    Anxiety     Anxiety 08/01/2021    B12 deficiency     Chronic back pain     Chronic constipation 06/27/2018    Chronic left hip pain     Chronic right lower quadrant pain 10/03/2022    COVID     Cystocele with rectocele     Diabetes mellitus, type 2     Diabetic neuropathy, type II diabetes mellitus     Encounter for screening for malignant neoplasm of breast     Esophageal reflux     Female bladder prolapse      Hypercholesterolemia     Hypertension     Intermittent claudication (CMS HCC)     Iron  deficiency anemia 06/19/2022    Leukocytes in urine 08/16/2022    Morbid obesity with BMI of 40.0-44.9, adult (CMS HCC)     OAB (overactive bladder) 06/27/2018    Osteoarthritis of spinal facet joint     Proteinuria 09/26/2023    Steatosis of liver     UTI (urinary tract infection) 09/11/2023    Vaginal candidiasis 09/11/2023    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    Vitamin D  deficiency      Past Surgical History:   Procedure Laterality Date    Bladder surgery  11/2019    Colonoscopy  12/12/2022    Hx appendectomy      Hx heart catheterization  10/19/2023    Hx hysterectomy  1995    Hx tubal ligation Bilateral 1993    Laminectomy and microdiscectomy lumbar spine N/A 03/19/2023    Repair rectocele  2023     Current Outpatient Medications   Medication Sig    acetaminophen (TYLENOL) 500 mg Oral Tablet Take 1 Tablet (500 mg total) by mouth Every 6 hours as needed    aspirin (ECOTRIN) 81 mg Oral Tablet, Delayed Release (E.C.) Take 1 Tablet (81 mg total) by mouth Daily    calcium citrate-vitamin D3 (CITRACAL) 200 mg-6.25 mcg (250 unit) Oral Tablet Take by mouth Once a day    cholecalciferol, vitamin D3, 25 mcg (1,000 unit) Oral Tablet Take 1 Tablet (1,000 Units total) by mouth Daily    collagen/biotin/ascorbic acid (COLLAGEN 1500 PLUS C ORAL) Take 1,000 mg by mouth    cyanocobalamin  (VITAMIN B 12) 1,000 mcg Oral Tablet Take 1 Tablet (1,000 mcg total) by mouth Once a day    INV pneumococcal 13-valent conjugate vaccine (PREVNAR 13) 0.5 mL IntraMUSCULAR Syringe Inject 0.5 mL into the muscle One time for 1 dose    losartan  (COZAAR ) 25 mg Oral Tablet Take 1 Tablet (25 mg total) by mouth Once a day for blood pressure    magnesium  chloride (SLOW-MAG) 64 mg Oral Tablet, Delayed Release (E.C.) Take 1 Tablet (64 mg total) by mouth Once a day    metFORMIN  (GLUCOPHAGE ) 500 mg Oral Tablet Take 2 Tablets (1,000 mg total) by mouth Twice daily     multivit-min-mfolate-K-herb289 (ALIVE WOMEN'S 50 PLUS ULTRA) 800 mcg DFE- 150 mcg Oral Tablet Take 1 Tablet by mouth Once a day    nystatin (NYSTOP) 100,000 unit/gram Powder APPLY TO THE AFFECTED  AREA(S) BY TOPICAL ROUTE 2 TIMES PER DAY    omega-3 fatty acid (LOVAZA) 1 gram Oral Capsule Take 2 Capsules (2 g total) by mouth Twice daily    omeprazole  (PRILOSEC) 40 mg Oral Capsule, Delayed Release(E.C.) Take 1 Capsule (40 mg total) by mouth Once a day Indications: bleeding from stomach, esophagus or duodenum    potassium gluconate 600 mg (99 mg) Oral Tablet Take 1 Tablet by mouth Daily    rosuvastatin  (CRESTOR ) 20 mg Oral Tablet Take 1 Tablet (20 mg total) by mouth Once a day    triamcinolone  acetonide 0.1 % Cream Apply 1 Application topically Once per day as needed for Other    varicella-zoster, PF, (SHINGRIX , PF,) 50 mcg/0.5 mL IntraMUSCULAR Suspension for Reconstitution Inject 0.5 mL into the muscle One time for 1 dose     Family Medical History:       Problem Relation (Age of Onset)    Breast Cancer Maternal Aunt    No Known Problems Father, Sister, Brother, Maternal Grandmother, Maternal Grandfather, Paternal Grandmother, Paternal Grandfather, Daughter, Son, Maternal Uncle, Paternal Aunt, Paternal Uncle, Other    Stomach Cancer Mother            Social History     Socioeconomic History    Marital status: Married     Spouse name: Ellee Wawrzyniak    Number of children: 1    Highest education level: 12th grade   Tobacco Use    Smoking status: Never    Smokeless tobacco: Never   Substance and Sexual Activity    Alcohol use: Never    Drug use: Never    Sexual activity: Not Currently     Partners: Male     Social Determinants of Health     Financial Resource Strain: Low Risk  (11/14/2022)    Financial Resource Strain     SDOH Financial: No   Transportation Needs: Low Risk  (11/14/2022)    Transportation Needs     SDOH Transportation: No   Social Connections: Low Risk  (11/14/2022)    Social Connections     SDOH Social  Isolation: 5 or more times a week   Intimate Partner Violence: Low Risk  (11/14/2022)    Intimate Partner Violence     SDOH Domestic Violence: No   Recent Concern: Intimate Partner Violence - High Risk (08/16/2022)    Intimate Partner Violence     SDOH Domestic Violence: No   Housing Stability: Low Risk  (11/14/2022)    Housing Stability     SDOH Housing Situation: I have housing.     SDOH Housing Worry: No   Health Literacy: Low Risk  (01/17/2023)    Health Literacy     SDOH Health Literacy: Never   Employment Status: Low Risk  (11/14/2022)    Employment Status     SDOH Employment: Otherwise unemployed but not seeking work (ex. Consulting civil engineer, retired, disabled, unpaid primary care giver)         List of Current Health Care Providers   Care Team       PCP       Name Type Specialty Phone Number    Dorsey Donny CROME, NP-C Nurse Practitioner FAMILY NURSE PRACTITIONER [phone]              Care Team       Name Type Specialty Phone Number    Cheryl Hamburg Not available Not available Not available    Cecilie Aleene PARAS, MD Physician [facility] SURGERY [phone]  [facility] Not available CARDIOVASCULAR DISEASE [phone]    Shellia No, [place] Not available [facility] [phone]                      Health Maintenance   Topic Date Due    Diabetic Kidney Health Microalb/Cr Ratio  Never done    RSV Adult 60+ or Pregnancy (1 - Risk 60-74 years 1-dose series) Never done    Colonoscopy  05/03/2021    Medicare Annual Wellness Visit  10/12/2023    Influenza Vaccine (Season Ended) 12/31/2023    Diabetic Retinal Exam  01/08/2024    Diabetic A1C  03/30/2024    Depression Screening  10/21/2024    Breast Cancer Screening  10/24/2024    Adult Tdap-Td  Discontinued    Diabetic Kidney Health eGFR  Discontinued    Osteoporosis screening  Discontinued    Hepatitis C screening  Discontinued    Shingles Vaccine  Discontinued    Covid-19 Vaccine  Discontinued    Pneumococcal Vaccination, Age 32+  Discontinued     Medicare  Wellness Assessment   Medicare initial or wellness physical in the last year?: No  Advance Directives   Does patient have a living will or MPOA: No           Advance directive information given to the patient today?: Patient Declined      Activities of Daily Living   Do you need help with dressing, bathing, or walking?: No   Do you need help with shopping, housekeeping, medications, or finances?: No   Do you have rugs in hallways, broken steps, or poor lighting?: No   Do you have grab bars in your bathroom, non-slip strips in your tub, and hand rails on your stairs?: Yes   Cognitive Function Screen (1=Yes, 0=No)   What is you age?: Correct   What is the time to the nearest hour?: Correct   What is the year?: Correct   What is the name of this clinic?: Correct   Can the patient recognize two persons (the doctor, the nurse, home help, etc.)?: Correct   What is the date of your birth? (day and month sufficient) : Correct   In what year did World War II end?: Incorrect   Who is the current president of the [place] ?: Correct   Count from 20 down to 1?: Correct   What address did I give you earlier?: Correct   Total Score: 9   Interpretation of Total Score: Greater than 6 Normal   Fall Risk Screen   Do you feel unsteady when standing or walking?: Yes  Do you worry about falling?: No  Have you fallen in the past year?: No   Depression Screen     Little interest or pleasure in doing things.: Not at all  Feeling down, depressed, or hopeless: Not at all  PHQ 2 Total: 0     Pain Score   Pain Score:   5    Substance Use-Abuse Screening     Tobacco Use     In Past 12 MONTHS, how often have you used any tobacco product (for example, cigarettes, e-cigarettes, cigars, pipes, or smokeless tobacco)?: Never     Alcohol use     In the PAST 12 MONTHS, how often have you had 5 (men)/4 (women) or more drinks containing alcohol in one day?: Never     Prescription Drug Use     In the PAST 12 months, how  often have you used any  prescription medications just for the feeling, more than prescribed, or that were not prescribed for you? Prescriptions may include: opioids, benzodiazepines, medications for ADHD: Never           Illicit Drug Use   In the PAST 12 MONTHS, how often have you used any drugs, including marijuana, cocaine or crack, heroin, methamphetamine, hallucinogens, ecstasy/MDMA?: Never                                OBJECTIVE:   There were no vitals taken for this visit.       Other appropriate exam:    Health Maintenance Due   Topic Date Due    Diabetic Kidney Health Microalb/Cr Ratio  Never done    RSV Adult 60+ or Pregnancy (1 - Risk 60-74 years 1-dose series) Never done    Colonoscopy  05/03/2021    Medicare Annual Wellness Visit  10/12/2023      ASSESSMENT & PLAN:   Assessment/Plan   1. Breast cancer screening    2. Medicare annual wellness visit, subsequent    3. Need for shingles vaccine    4. Postmenopausal status    5. Gait abnormality    6. History of falling    7. Muscle weakness (generalized)    8. Cardiovascular event risk    9. Risk for falls       Identified Risk Factors/ Recommended Actions     Fall Risk Follow up plan of care: Vitamin D  supplementation advised  Discussed optimizing home safety  Manage & monitor hypotension  Medications managed to minimize fall risk  Footwear and potential problems addressed  The PHQ 2 Total: 0 depression screen is interpreted as negative.    Urinary Incontinence Plan of Care: Addressed comorbid conditions   Lifestyle modifications  Reassess at follow up visit    Patient declined Advanced Directives information.        Orders Placed This Encounter    varicella-zoster, PF, (SHINGRIX , PF,) 50 mcg/0.5 mL IntraMUSCULAR Suspension for Reconstitution    INV pneumococcal 13-valent conjugate vaccine (PREVNAR 13) 0.5 mL IntraMUSCULAR Syringe        The patient has been educated about risk factors and recommended preventive care. Written Prevention Plan completed/ updated and given to  patient (see After Visit Summary).    Return in about 1 year (around 10/21/2024) for Telephone Visit.    Donny LITTIE Clonts, NP-C

## 2023-10-27 NOTE — Patient Instructions (Signed)
 Medicare Preventive Services  Medicare coverage information Recommendation for YOU   Heart Disease and Diabetes   Lipid profile Every 5 years or more often if at risk for cardiovascular disease   No results found for: CHOLESTEROL, HDLCHOL, LDLCHOL, LDLCHOLDIR, TRIG      Diabetes Screening    Yearly for those at risk for diabetes, 2 tests per year for those with prediabetes Last Glucose: 114    Diabetes Self Management Training or Medical Nutrition Therapy  For those with diabetes, up to 10 hrs initial training within a year, subsequent years up to 2 hrs of follow up training Optional for those with diabetes     Medical Nutrition Therapy  Three hours of one-on-one counseling in first year, two hours in subsequent years Optional for those with diabetes, kidney disease   Intensive Behavioral Therapy for Obesity  Face-to-face counseling, first month every week, month 2-6 every other week, month 7-12 every month if continued progress is documented Optional for those with Body Mass Index 30 or higher  Your There is no height or weight on file to calculate BMI.   Tobacco Cessation (Quitting) Counseling   Covers up to 8 smoking and tobacco-use cessation counseling sessions in a 1-month period.    Optional for those that use tobacco   Cancer Screening Last Completion Date   Colorectal screening   For anyone age 4 to 24 or any age if high risk:  Screening Colonoscopy every 10 yrs if low risk,  more frequent if higher risk  OR  Cologuard Stool DNA test once every 3 years OR  Fecal Occult Blood Testing yearly OR  Flexible  Sigmoidoscopy  every 5 yr OR  CT Colonography every 5 yrs    --05/03/2016  See below for due date if applicable.   Screening Pap Test   Recommended every 3 years for all women age 35 to 68, or every five years if combined with HPV test (routine screening not needed after total hysterectomy).  Medicare covers every 2 years or yearly if high risk.  Screening Pelvic Exam   Medicare covers every 2  years, yearly if high risk or childbearing age with abnormal Pap in last 3 yrs.     See below for due date if applicable.   Screening Mammogram   Recommended every 2 years for women age 72 to 44, or more frequent if you have a higher risk. Selectively recommended for women between 40-49 based on shared decisions about risk. Covered by Medicare up to every year for women age 73 or older --10/25/2022  See below for due date if applicable.         Lung Cancer Screening  Annual low dose computed tomography (LDCT scan) is recommended for those age 83-80 who smoked 20 pack-years and are current smokers or quit smoking within past 15 years, after counseling by your doctor or nurse clinician about the possible benefits or harms.     See below for due date if applicable.   Vaccinations   Respiratory syncytial virus (RSV)  Age 87 years or older: Based on shared clinical decision-making with your provider.  Pneumococcal Vaccine  Recommended routinely age 57+ with one or two separate vaccines based on your risk. Recommended before age 49 if medical conditions with increased risk  Seasonal Influenza Vaccine  Once every flu season   Hepatitis B Vaccine  3 doses if risk (including anyone with diabetes or liver disease)  Shingles Vaccine  Two doses at age 73 or  older  Diphtheria Tetanus Pertussis Vaccine  ONCE as adult, booster every 10 years     Immunization History   Administered Date(s) Administered   . AFLURIA-Influenza Vaccine (3 Yr+)(Admin) 03/24/2015   . Covid-19 Vaccine,Moderna,12 Years+ 07/18/2019, 08/15/2019   . Influenza Vaccine, 6 month-adult 04/05/2020   . Tetanus Toxoid/Diphtheria Toxoid/Acellular Pertussis Vaccine, Adsorbed 11/25/2014, 07/03/2023     Shingles vaccine and Diphtheria Tetanus Pertussis vaccines are available at pharmacies or local health department without a prescription.   Other Preventative Screening  Last Completion Date   Bone Densitometry   Screening: All females ages 34 and older every 10 years if  initial screening normal. Postmenopausal women ages 44-64 need screening with one or more risk factor: previous fracture, parental hip fracture, current smoker, low body weight, excessive alcohol use, Rheumatoid Arthritis   For women with diagnosed Osteoporosis, follow up is recommended every 2 years or a frequency recommended by your provider.     --10/25/2022  See below for due date if applicable.     Glaucoma Screening   Yearly if in high risk group such as diabetes, family history, African American age 35+ or Hispanic American age 76+   See your eye care provider for screening.   Hepatitis C Screening   Recommended  for those born between ages 18-79 years.     See below for due date if applicable.     HIV Testing  Recommended routinely at least ONCE, covered every year for age 66 to 53 regardless of risk, and every year for age over 34 who ask for the test or higher risk. Yearly or up to 3 times in pregnancy         See below for due date if applicable.   Abdominal Aortic Aneurysm Screening Ultrasound   Once with a family history of abdominal aortic aneurysms OR a female between65-75 and have smoked at least 100 cigarettes in your lifetime.         See below for due date if applicable.       Your Personalized Schedule for Preventive Tests   Health Maintenance: Pending and Last Completed       Date Due Completion Date    Diabetic Kidney Health Microalb/Cr Ratio Never done ---    RSV Adult 60+ or Pregnancy (1 - Risk 60-74 years 1-dose series) Never done ---    Colonoscopy 05/03/2021 05/03/2016    Medicare Annual Wellness Visit 10/12/2023 10/12/2022    Influenza Vaccine (Season Ended) 12/31/2023 04/05/2020    Diabetic Retinal Exam 01/08/2024 01/08/2023    Diabetic A1C 03/30/2024 09/28/2023    Depression Screening 10/21/2024 10/22/2023    Breast Cancer Screening 10/24/2024 10/25/2022                For Information on Advanced Directives for Health Care:  [place]:  [email or web address]  [place], [place], [place], [facility]  Information: [email or web address]

## 2023-11-14 NOTE — Result Encounter Note (Signed)
 Last colonoscopy on file is from 2018. Patient states she has had one since then. Request faxed to K. Patel's office for report

## 2023-11-15 ENCOUNTER — Ambulatory Visit (HOSPITAL_COMMUNITY): Payer: Self-pay

## 2023-11-21 ENCOUNTER — Other Ambulatory Visit: Payer: Self-pay

## 2023-11-21 ENCOUNTER — Ambulatory Visit
Admission: RE | Admit: 2023-11-21 | Discharge: 2023-11-21 | Disposition: A | Discharge status: Home / Self Care | Payer: Self-pay | Source: Ambulatory Visit | Attending: Family | Admitting: Family

## 2023-11-21 ENCOUNTER — Encounter (HOSPITAL_COMMUNITY): Payer: Self-pay

## 2023-11-21 DIAGNOSIS — Z1231 Encounter for screening mammogram for malignant neoplasm of breast: Secondary | ICD-10-CM | POA: Insufficient documentation

## 2023-11-21 DIAGNOSIS — Z1239 Encounter for other screening for malignant neoplasm of breast: Secondary | ICD-10-CM

## 2023-11-23 DIAGNOSIS — Z1231 Encounter for screening mammogram for malignant neoplasm of breast: Secondary | ICD-10-CM

## 2023-12-02 ENCOUNTER — Other Ambulatory Visit (RURAL_HEALTH_CENTER): Payer: Self-pay | Admitting: Family

## 2023-12-03 ENCOUNTER — Ambulatory Visit (RURAL_HEALTH_CENTER): Payer: Self-pay | Admitting: Family

## 2023-12-03 NOTE — Result Encounter Note (Signed)
 Patient informed and voiced understanding

## 2023-12-10 ENCOUNTER — Ambulatory Visit (HOSPITAL_BASED_OUTPATIENT_CLINIC_OR_DEPARTMENT_OTHER)
Admission: RE | Admit: 2023-12-10 | Discharge: 2023-12-10 | Disposition: A | Discharge status: Home / Self Care | Source: Ambulatory Visit | Attending: Family

## 2023-12-10 ENCOUNTER — Other Ambulatory Visit: Payer: Self-pay

## 2023-12-10 ENCOUNTER — Ambulatory Visit (RURAL_HEALTH_CENTER): Attending: Family | Admitting: Family

## 2023-12-10 ENCOUNTER — Ambulatory Visit
Admission: RE | Admit: 2023-12-10 | Discharge: 2023-12-10 | Disposition: A | Discharge status: Home / Self Care | Source: Ambulatory Visit | Attending: Family | Admitting: Family

## 2023-12-10 ENCOUNTER — Encounter (RURAL_HEALTH_CENTER): Payer: Self-pay | Admitting: Family

## 2023-12-10 VITALS — BP 131/63 | HR 72 | Temp 98.9°F | Wt 237.0 lb

## 2023-12-10 DIAGNOSIS — S4992XA Unspecified injury of left shoulder and upper arm, initial encounter: Secondary | ICD-10-CM | POA: Insufficient documentation

## 2023-12-10 DIAGNOSIS — Z6839 Body mass index (BMI) 39.0-39.9, adult: Secondary | ICD-10-CM | POA: Insufficient documentation

## 2023-12-10 DIAGNOSIS — W19XXXA Unspecified fall, initial encounter: Secondary | ICD-10-CM | POA: Insufficient documentation

## 2023-12-10 DIAGNOSIS — E669 Obesity, unspecified: Secondary | ICD-10-CM | POA: Insufficient documentation

## 2023-12-10 NOTE — Nursing Note (Signed)
 Patient states she fell 11/06/2023 at [facility] off of scooter and fell on left arm
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
## 2023-12-11 ENCOUNTER — Other Ambulatory Visit (RURAL_HEALTH_CENTER): Payer: Self-pay | Admitting: Family

## 2023-12-11 ENCOUNTER — Ambulatory Visit (RURAL_HEALTH_CENTER): Payer: Self-pay | Admitting: Family

## 2023-12-11 DIAGNOSIS — S4992XA Unspecified injury of left shoulder and upper arm, initial encounter: Secondary | ICD-10-CM

## 2023-12-11 DIAGNOSIS — W19XXXA Unspecified fall, initial encounter: Secondary | ICD-10-CM

## 2023-12-11 NOTE — Result Encounter Note (Signed)
 Patient informed and voiced understanding

## 2023-12-15 ENCOUNTER — Other Ambulatory Visit: Payer: Self-pay

## 2023-12-27 ENCOUNTER — Other Ambulatory Visit (RURAL_HEALTH_CENTER): Payer: Self-pay | Admitting: Family

## 2024-01-03 ENCOUNTER — Ambulatory Visit (RURAL_HEALTH_CENTER): Payer: Self-pay | Admitting: Family

## 2024-01-21 ENCOUNTER — Ambulatory Visit (RURAL_HEALTH_CENTER): Payer: Self-pay | Attending: Family | Admitting: Family

## 2024-01-21 ENCOUNTER — Telehealth (RURAL_HEALTH_CENTER): Payer: Self-pay | Admitting: Family

## 2024-01-21 ENCOUNTER — Other Ambulatory Visit: Payer: Self-pay

## 2024-01-21 ENCOUNTER — Encounter (RURAL_HEALTH_CENTER): Payer: Self-pay | Admitting: Family

## 2024-01-21 VITALS — BP 140/66 | HR 69 | Temp 98.4°F | Wt 232.0 lb

## 2024-01-21 DIAGNOSIS — I129 Hypertensive chronic kidney disease with stage 1 through stage 4 chronic kidney disease, or unspecified chronic kidney disease: Secondary | ICD-10-CM | POA: Insufficient documentation

## 2024-01-21 DIAGNOSIS — N182 Chronic kidney disease, stage 2 (mild): Secondary | ICD-10-CM | POA: Insufficient documentation

## 2024-01-21 DIAGNOSIS — I10 Essential (primary) hypertension: Secondary | ICD-10-CM

## 2024-01-21 DIAGNOSIS — E1122 Type 2 diabetes mellitus with diabetic chronic kidney disease: Secondary | ICD-10-CM | POA: Insufficient documentation

## 2024-01-21 DIAGNOSIS — Z79899 Other long term (current) drug therapy: Secondary | ICD-10-CM | POA: Insufficient documentation

## 2024-01-21 DIAGNOSIS — E538 Deficiency of other specified B group vitamins: Secondary | ICD-10-CM | POA: Insufficient documentation

## 2024-01-21 DIAGNOSIS — Z6838 Body mass index (BMI) 38.0-38.9, adult: Secondary | ICD-10-CM | POA: Insufficient documentation

## 2024-01-21 DIAGNOSIS — E611 Iron deficiency: Secondary | ICD-10-CM | POA: Insufficient documentation

## 2024-01-21 DIAGNOSIS — E559 Vitamin D deficiency, unspecified: Secondary | ICD-10-CM | POA: Insufficient documentation

## 2024-01-21 DIAGNOSIS — E1142 Type 2 diabetes mellitus with diabetic polyneuropathy: Secondary | ICD-10-CM | POA: Insufficient documentation

## 2024-01-21 DIAGNOSIS — K219 Gastro-esophageal reflux disease without esophagitis: Secondary | ICD-10-CM | POA: Insufficient documentation

## 2024-01-21 DIAGNOSIS — E1140 Type 2 diabetes mellitus with diabetic neuropathy, unspecified: Secondary | ICD-10-CM | POA: Insufficient documentation

## 2024-01-21 DIAGNOSIS — E785 Hyperlipidemia, unspecified: Secondary | ICD-10-CM | POA: Insufficient documentation

## 2024-01-21 DIAGNOSIS — R531 Weakness: Secondary | ICD-10-CM | POA: Insufficient documentation

## 2024-01-21 DIAGNOSIS — R269 Unspecified abnormalities of gait and mobility: Secondary | ICD-10-CM | POA: Insufficient documentation

## 2024-01-21 DIAGNOSIS — E1169 Type 2 diabetes mellitus with other specified complication: Secondary | ICD-10-CM | POA: Insufficient documentation

## 2024-01-21 NOTE — Assessment & Plan Note (Addendum)
 A1c 7.0,  06/2023.   Currenty A1c is pending.  Continue current medications.  Patient reports she has been sedentary and has not been eating well due to chronic pain.  Reports she wants to try and work on diet and physical activity as well as weight loss in the next 3 months.  Declines medication changes at this time.    Advised a diabetic diet and advised 20-30 minutes of daily physical activity.

## 2024-01-21 NOTE — Nursing Note (Signed)
 Patient here for follow up with no new complaints

## 2024-01-21 NOTE — Assessment & Plan Note (Signed)
 [facility] labs dated 01/02/2024 indicate cholesterol 199, triglycerides 222, HDL 50 and LDL 111.   Continue Lovaza and Crestor  20 mg daily.  Advised to limit high fat foods and processed foods in diet.

## 2024-01-21 NOTE — Assessment & Plan Note (Signed)
 BP elevated today.  Patient is asymptomatic.  Patient reports her blood pressure is normal at home.  Continue current medications.  Avoid OTC NSAID'S.  Limit sodium in diet.    Advised patient she develops chest pain, palpitations or shortness for breath to go to emergency room for further evaluation.  Managed by Cardiology.

## 2024-01-21 NOTE — Assessment & Plan Note (Addendum)
 Managed by Dr. Concha Se.

## 2024-01-22 NOTE — Telephone Encounter (Signed)
Both requests faxed.

## 2024-01-23 ENCOUNTER — Encounter (RURAL_HEALTH_CENTER): Payer: Self-pay | Admitting: Family

## 2024-01-23 ENCOUNTER — Telehealth (RURAL_HEALTH_CENTER): Payer: Self-pay | Admitting: Family

## 2024-01-23 NOTE — Assessment & Plan Note (Addendum)
 [facility] results dated 01/02/2024   A1c not drawn at labs visit.  Advise a diabetic diet, advise to limit simple carbs in diet.  Encourage 20 minutes of daily daily physical activity.  Advise annual diabetic eye exams.  Advise annual diabetic foot exams.

## 2024-01-23 NOTE — Assessment & Plan Note (Signed)
 Vitamin D 63 - Continue OTC vitamin D 4000 IU daily.
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
## 2024-01-23 NOTE — Assessment & Plan Note (Signed)
 BMI Advised a low-fat/low sodium diet, advised 150 minutes of scheduled weekly physical activity as tolerated.  Advised to maintain a healthy weight.

## 2024-01-23 NOTE — Assessment & Plan Note (Signed)
 Vitamin B12 697 - Continue vitamin B12 1000 mcg daily.

## 2024-01-23 NOTE — Assessment & Plan Note (Signed)
 Further treatment pending labs.  Advise an iron  rick diet.

## 2024-01-24 ENCOUNTER — Other Ambulatory Visit (RURAL_HEALTH_CENTER): Payer: Self-pay | Admitting: Family

## 2024-01-28 ENCOUNTER — Other Ambulatory Visit (RURAL_HEALTH_CENTER): Payer: Self-pay

## 2024-02-28 ENCOUNTER — Other Ambulatory Visit (RURAL_HEALTH_CENTER): Payer: Self-pay | Admitting: Family

## 2024-04-21 ENCOUNTER — Telehealth (RURAL_HEALTH_CENTER): Payer: Self-pay | Admitting: Family

## 2024-04-21 ENCOUNTER — Encounter (RURAL_HEALTH_CENTER): Payer: Self-pay | Admitting: Family

## 2024-04-21 ENCOUNTER — Ambulatory Visit (RURAL_HEALTH_CENTER): Payer: Self-pay | Attending: Family | Admitting: Family

## 2024-04-21 ENCOUNTER — Other Ambulatory Visit: Payer: Self-pay

## 2024-04-21 VITALS — BP 127/68 | HR 98 | Temp 99.3°F | Ht 65.0 in | Wt 241.0 lb

## 2024-04-21 DIAGNOSIS — N182 Chronic kidney disease, stage 2 (mild): Secondary | ICD-10-CM | POA: Insufficient documentation

## 2024-04-21 DIAGNOSIS — I129 Hypertensive chronic kidney disease with stage 1 through stage 4 chronic kidney disease, or unspecified chronic kidney disease: Secondary | ICD-10-CM | POA: Insufficient documentation

## 2024-04-21 DIAGNOSIS — E559 Vitamin D deficiency, unspecified: Secondary | ICD-10-CM | POA: Insufficient documentation

## 2024-04-21 DIAGNOSIS — E538 Deficiency of other specified B group vitamins: Secondary | ICD-10-CM | POA: Insufficient documentation

## 2024-04-21 DIAGNOSIS — E1142 Type 2 diabetes mellitus with diabetic polyneuropathy: Secondary | ICD-10-CM | POA: Insufficient documentation

## 2024-04-21 DIAGNOSIS — K219 Gastro-esophageal reflux disease without esophagitis: Secondary | ICD-10-CM | POA: Insufficient documentation

## 2024-04-21 DIAGNOSIS — E1169 Type 2 diabetes mellitus with other specified complication: Secondary | ICD-10-CM | POA: Insufficient documentation

## 2024-04-21 DIAGNOSIS — K760 Fatty (change of) liver, not elsewhere classified: Secondary | ICD-10-CM | POA: Insufficient documentation

## 2024-04-21 DIAGNOSIS — Z7409 Other reduced mobility: Secondary | ICD-10-CM | POA: Insufficient documentation

## 2024-04-21 DIAGNOSIS — E1122 Type 2 diabetes mellitus with diabetic chronic kidney disease: Secondary | ICD-10-CM | POA: Insufficient documentation

## 2024-04-21 DIAGNOSIS — E785 Hyperlipidemia, unspecified: Secondary | ICD-10-CM | POA: Insufficient documentation

## 2024-04-21 MED ORDER — MOUNJARO 2.5 MG/0.5 ML SUBCUTANEOUS PEN INJECTOR: 2.5000 mg | PEN_INJECTOR | SUBCUTANEOUS | 0 refills | Status: AC

## 2024-04-21 NOTE — Nursing Note (Signed)
 Patient here for follow up. Complains of right foot pain x 3 weeks

## 2024-04-21 NOTE — Progress Notes (Signed)
 FAMILY MEDICINE, [facility] FAMILY MEDICINE [facility]  [address]  [place] [address]  Operated by [facility]     Name: Amber Owens MRN:  [id number]   Date of Birth: [birth year] Age: 68 y.o.   Date: 04/21/2024  Time: 16:11     Provider: Donny LITTIE Clonts, APRN, CNP    Reason for visit: Follow Up 3 Months      History of Present Illness:  History of Present Illness  Amber Owens is a 68 year old female with type 2 diabetes, hypertension, hyperlipidemia, and cardiomyopathy who presents for chronic disease management.    She has type 2 diabetes with elevated blood sugar levels, including a recent glucose reading of 183 mg/dL and an J8r of 2.2%. She is not on insulin and is concerned about her diet, acknowledging the consumption of sweets and high-carb foods. She has previously lost weight but finds it challenging to maintain due to her current lifestyle and dietary habits.    She has hypertension and hyperlipidemia, with a recent cholesterol level of 204 mg/dL, triglycerides at 740 mg/dL, HDL at 50 mg/dL, and LDL at 889 mg/dL. She takes omega-3 supplements but experiences stomach discomfort with increased doses.    She has a history of cardiomyopathy and had a defibrillator implanted. Her ejection fraction has improved to between 55 and 60. She is planning to undergo knee surgery and is in the process of obtaining cardiac clearance for the procedure.    She experiences impaired functional mobility, balance, gait, and endurance. She reports difficulty walking and mentions recent foot pain. No falls have occurred, but she is cautious about her mobility.    She has GERD, vitamin D  deficiency, and vitamin B12 deficiency, for which she is taking supplements. She also reports low magnesium  levels.    She is morbidly obese, with a weight of approximately 240 pounds, and acknowledges the impact of her weight on her overall health, including joint pain and mobility issues. She is motivated to  lose weight for her health.       Patient Active Problem List    Diagnosis Date Noted    Fall 12/10/2023    Injury of left shoulder and upper arm 12/10/2023    Medicare annual wellness visit, subsequent 10/27/2023    Breast cancer screening 10/27/2023    Muscle weakness (generalized) 10/27/2023    Breast cancer screening by mammogram 10/27/2023    Abnormal cardiovascular stress test 10/19/2023    Cardiomyopathy 10/09/2023    LBBB (left bundle branch block) 10/09/2023    S/P lumbar laminectomy 04/19/2023    Type 2 diabetes mellitus with diabetic polyneuropathy, without long-term current use of insulin (CMS HCC) 03/05/2023    Lumbar radiculopathy 01/09/2023    Osteoarthritis of right knee 01/05/2023    Lumbar facet arthropathy 12/04/2022    Multilevel degenerative disc disease 11/14/2022    Disc-osteophyte complex 11/14/2022    Osteopenia after menopause 11/14/2022      DEXA 10/25/2022 indicates osteopenia.       Fatty liver 11/14/2022     Dr. LOIS Blanch.      Need for shingles vaccine 10/12/2022    Postmenopausal status 10/12/2022    Gait abnormality 10/12/2022    History of falling 10/12/2022    Cardiovascular event risk 10/12/2022    Low back pain 08/16/2022    Hypomagnesemia 06/19/2022     Rx:  Magnesium  64 mg daily.      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 Sheralyn.      Hyperlipidemia due to type 2 diabetes mellitus (CMS HCC)  (CMS HCC) 08/01/2021    Gastroesophageal reflux disease without esophagitis 08/01/2021    Environmental and seasonal allergies 08/01/2021     Advised and over-the-counter antihistamine.  Patient declined respiratory screenings.       Colon cancer screening 08/01/2021    Vitamin D  deficiency  08/01/2021     Vitamin-4000 IU daily and OTC calcium 1200 mg daily.         Historical Data    Past Medical History:  Past Medical History:   Diagnosis Date    Abdominal pain     Acute cystitis with hematuria 10/03/2022    Anxiety     Anxiety 08/01/2021    B12 deficiency     Chronic back pain     Chronic constipation 06/27/2018    Chronic left hip pain     Chronic right lower quadrant pain 10/03/2022    COVID     Cystocele with rectocele     Diabetes mellitus, type 2     Diabetic neuropathy, type II diabetes mellitus     Encounter for screening for malignant neoplasm of breast     Esophageal reflux     Female bladder prolapse     Hypercholesterolemia     Hypertension     Intermittent claudication (CMS HCC)     Iron  deficiency 09/12/2023    Iron  deficiency anemia 06/19/2022    Leukocytes in urine 08/16/2022    Morbid obesity with BMI of 40.0-44.9, adult (CMS HCC)     OAB (overactive bladder) 06/27/2018    Osteoarthritis of spinal facet joint     Proteinuria 09/26/2023    Steatosis of liver     UTI (urinary tract infection) 09/11/2023    Vaginal candidiasis 09/11/2023    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 Tobie JOY APPENDECTOMY      HX HEART CATHETERIZATION  10/19/2023    [facility]    HX HYSTERECTOMY  1995    HX TUBAL LIGATION Bilateral 1993    LAMINECTOMY AND MICRODISCECTOMY LUMBAR SPINE N/A 03/19/2023    Dr. Vonna- [facility]    REPAIR RECTOCELE  2023    Dr. Florina     Allergies:  Allergies[1]  Medications:  Current Outpatient Medications   Medication Sig    acetaminophen (TYLENOL) 500 mg Oral Tablet Take 1 Tablet (500 mg total) by mouth Every 6 hours as needed    calcium citrate-vitamin D3 (CITRACAL) 200 mg-6.25 mcg (250 unit) Oral Tablet Take by mouth Once a day    cholecalciferol, vitamin D3, 25 mcg (1,000 unit)  Oral Tablet Take 1 Tablet (1,000 Units total) by mouth Daily    collagen/biotin/ascorbic acid (COLLAGEN 1500 PLUS C ORAL) Take 1,000 mg by mouth    cyanocobalamin  (VITAMIN B 12) 1,000 mcg Oral Tablet Take 1 Tablet (1,000 mcg total)  by mouth Once a day    dapagliflozin propanediol (FARXIGA) 10 mg Oral Tablet Take 1 Tablet (10 mg total) by mouth Daily    magnesium  chloride (SLOW-MAG) 64 mg Oral Tablet, Delayed Release (E.C.) Take 1 Tablet (64 mg total) by mouth Once a day    metFORMIN  (GLUCOPHAGE ) 500 mg Oral Tablet TAKE 2 TABLETS (1,000 MG TOTAL) BY MOUTH TWICE DAILY    metoprolol succinate (TOPROL-XL) 25 mg Oral Tablet Sustained Release 24 hr Take 1 Tablet (25 mg total) by mouth Daily    multivit-min-mfolate-K-herb289 (ALIVE WOMEN'S 50 PLUS ULTRA) 800 mcg DFE- 150 mcg Oral Tablet Take 1 Tablet by mouth Once a day    nystatin (NYSTOP) 100,000 unit/gram Powder APPLY TO THE AFFECTED AREA(S) BY TOPICAL ROUTE 2 TIMES PER DAY    omega-3 fatty acid (LOVAZA) 1 gram Oral Capsule TAKE 2 CAPSULES (2 G TOTAL) BY MOUTH TWICE DAILY    omeprazole  (PRILOSEC) 40 mg Oral Capsule, Delayed Release(E.C.) TAKE 1 CAPSULE BY MOUTH EVERY DAY FOR BLEED    rosuvastatin  (CRESTOR ) 20 mg Oral Tablet TAKE 1 TABLET BY MOUTH EVERY DAY    sacubitriL-valsartan (ENTRESTO) 24-26 mg Oral Tablet Take 1 Tablet by mouth Twice daily    spironolactone (ALDACTONE) 25 mg Oral Tablet Take 0.5 Tablets (12.5 mg total) by mouth Daily    tirzepatide  (MOUNJARO ) 2.5 mg/0.5 mL Subcutaneous Pen Injector Inject 0.5 mL (2.5 mg total) under the skin Every 7 days for 30 days    triamcinolone  acetonide 0.1 % Cream Apply 1 Application topically Once per day as needed for Other     Family History:  Family Medical History:       Problem Relation (Age of Onset)    Breast Cancer Maternal Aunt    No Known Problems Father, Sister, Brother, Maternal Grandmother, Maternal Grandfather, Paternal Grandmother, Paternal Grandfather, Daughter, Son, Maternal Uncle, Paternal Aunt, Paternal  Uncle, Other    Stomach Cancer Mother            Social History:  Social History     Socioeconomic History    Marital status: Married     Spouse name: Haydyn Liddell    Number of children: 1    Highest education level: 12th grade   Tobacco Use    Smoking status: Never    Smokeless tobacco: Never   Substance and Sexual Activity    Alcohol use: Never    Drug use: Never    Sexual activity: Not Currently     Partners: Male     Social Determinants of Health     Financial Resource Strain: Low Risk (11/14/2022)    Financial Resource Strain     SDOH Financial: No   Transportation Needs: Low Risk (11/14/2022)    Transportation Needs     SDOH Transportation: No   Social Connections: Low Risk (11/14/2022)    Social Connections     SDOH Social Isolation: 5 or more times a week   Intimate Partner Violence: Low Risk (11/14/2022)    Intimate Partner Violence     SDOH Domestic Violence: No   Recent Concern: Intimate Partner Violence - High Risk (08/16/2022)    Intimate Partner Violence     SDOH Domestic Violence: No   Housing Stability: Low Risk (11/14/2022)    Housing Stability     SDOH Housing Situation: I have housing.     SDOH Housing Worry: No           Review of Systems:  Any pertinent Review of Systems  as addressed in the HPI above.    Physical Exam:  Vital Signs:  Vitals:    04/21/24 1334   BP: 127/68   Pulse: 98   Temp: 37.4 C (99.3 F)   SpO2: 96%   Weight: 109 kg (241 lb)   Height: 1.651 m (5' 5)   BMI: 40.1     Physical Exam  Vitals reviewed.   Constitutional:       Appearance: She is morbidly obese.   Cardiovascular:      Rate and Rhythm: Normal rate and regular rhythm.      Pulses: Normal pulses.      Heart sounds: Normal heart sounds, S1 normal and S2 normal.   Pulmonary:      Effort: Pulmonary effort is normal.      Breath sounds: Normal breath sounds.   Abdominal:      General: Bowel sounds are normal.      Palpations: Abdomen is soft.   Musculoskeletal:      Cervical back: Neck supple.      Right lower leg: No edema.       Left lower leg: No edema.   Skin:     General: Skin is warm.   Neurological:      General: No focal deficit present.      Mental Status: She is alert and oriented to person, place, and time.      Cranial Nerves: Cranial nerves 2-12 are intact.      Motor: Weakness present.      Coordination: Coordination abnormal.      Gait: Gait abnormal (in a wheelchair today; standing with assistance).   Psychiatric:         Mood and Affect: Mood normal.         Behavior: Behavior normal. Behavior is cooperative.         Thought Content: Thought content normal.         Cognition and Memory: Cognition normal.         Judgment: Judgment normal.          Results  Labs  Glucose (Monday April 21, 2024): 183  Hemoglobin A1c (Monday April 21, 2024): 7.7  Cholesterol (Monday April 21, 2024): 204  Triglycerides (Monday April 21, 2024): 259  HDL (Monday April 21, 2024): 50  LDL (Monday April 21, 2024): 110  AST (Monday April 21, 2024): 19  ALT (Monday April 21, 2024): 17  TSH (Monday April 21, 2024): 3.1  Vitamin D  (Monday April 21, 2024): 49  Vitamin B12 (Monday April 21, 2024): 832         ICD-10-CM    1. Type 2 diabetes mellitus with diabetic polyneuropathy, without long-term current use of insulin (CMS HCC)  E11.42       2. 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 Hyperlipidemia due to type 2 diabetes mellitus (CMS HCC)  (CMS HCC)  E11.69     E78.5       4. Gastroesophageal reflux disease without esophagitis  K21.9       5. Vitamin D  deficiency  E55.9       6. B12 deficiency  E53.8       7. Morbid obesity (CMS HCC)  E66.01       8. Impaired functional mobility, balance, gait, and endurance  Z74.09       9. Fatty liver  K76.0       10. Hypomagnesemia  E83.42          Assessment/Plan:  Assessment & Plan  Type 2 diabetes mellitus with diabetic polyneuropathy  Elevated glucose and A1c levels with  neuropathy symptoms likely exacerbated by hyperglycemia. Discussed Mounjaro  for weight loss and glycemic control, emphasizing dietary changes to prevent complications. Explained Mounjaro 's mechanism, potential side effects, and the importance of hydration and bowel movements.  - Submit prior authorization for Mounjaro  to start on January 1st.  - Ensure diabetic exam is completed.    Hyperlipidemia due to type 2 diabetes mellitus  Cholesterol levels elevated. Discussed increasing omega-3 fatty acid intake and dietary modifications to improve lipid profile.  - Increase omega-3 fatty acid intake to two capsules in the morning and two at night.  - Continue dietary modifications to improve lipid profile.    Hypertension  - BP well controlled.  Patient is asymptomatic.  - Continue current medications.  Limit sodium in diet.  Avoid over-the-counter NSAIDs.    Morbid obesity  Contributing to impaired mobility and increased risk of complications. Discussed Mounjaro  for weight loss and its benefits. Emphasized dietary changes and lifestyle modifications.  - Submit prior authorization for Mounjaro  to start on January 1st.  - Implement dietary changes to reduce caloric intake and improve nutritional quality.    Impaired functional mobility, balance, gait, and endurance  Impaired mobility likely due to morbid obesity and knee osteoarthritis. Emphasized weight loss and dietary changes to improve mobility.  - Implement weight loss and dietary changes to improve mobility.  - Patient declined physical therapy at this time.    Gastroesophageal reflux disease  -  Symptoms well controlled  -  Continue current medications    Vitamin D  deficiency  Vitamin D  level indicates deficiency. Continued supplementation is necessary.  - Continue vitamin D  supplementation.    Vitamin B12 deficiency  Vitamin B12 level indicates deficiency. Continued supplementation is necessary.  - Continue vitamin B12 supplementation.    Hypomagnesemia  -Continue  magnesium  supplementation.       Orders Placed This Encounter    tirzepatide  (MOUNJARO ) 2.5 mg/0.5 mL Subcutaneous Pen Injector      There are no discontinued medications.    Tobacco cessation counseling not performed due to nonsmoker.     Labs discussed today.  Continue current medications.  Advised a low-fat/low sodium diet, advised 150 minutes of scheduled weekly physical activity as tolerated.  Advised to maintain a healthy weight.     Return in about 3 months (around 07/20/2024) for ROUTINE APPT-30 MIN.    Donny LITTIE Clonts, APRN, CNP     This note was created with assistance from [employer] via capture of conversational audio. Consent was obtained from the patient and all parties present prior to recording.      Portions of this note may be dictated using voice recognition software or a dictation service. Variances in spelling and vocabulary are possible and unintentional. Not all errors are caught/corrected. Please notify the dino if any discrepancies are noted or if the meaning of any statement is not clear.          [1] No Known Allergies

## 2024-05-08 ENCOUNTER — Other Ambulatory Visit (RURAL_HEALTH_CENTER): Payer: Self-pay | Admitting: Family

## 2024-05-20 ENCOUNTER — Other Ambulatory Visit (HOSPITAL_COMMUNITY): Payer: Self-pay | Admitting: Orthopaedic Surgery

## 2024-05-20 DIAGNOSIS — M25561 Pain in right knee: Secondary | ICD-10-CM

## 2024-06-03 ENCOUNTER — Other Ambulatory Visit (RURAL_HEALTH_CENTER): Payer: Self-pay | Admitting: Family

## 2024-06-03 DIAGNOSIS — K219 Gastro-esophageal reflux disease without esophagitis: Secondary | ICD-10-CM

## 2024-06-03 DIAGNOSIS — E1142 Type 2 diabetes mellitus with diabetic polyneuropathy: Secondary | ICD-10-CM

## 2024-06-03 DIAGNOSIS — K760 Fatty (change of) liver, not elsewhere classified: Secondary | ICD-10-CM

## 2024-06-03 DIAGNOSIS — I129 Hypertensive chronic kidney disease with stage 1 through stage 4 chronic kidney disease, or unspecified chronic kidney disease: Secondary | ICD-10-CM

## 2024-06-03 DIAGNOSIS — E1169 Type 2 diabetes mellitus with other specified complication: Secondary | ICD-10-CM

## 2024-06-13 ENCOUNTER — Ambulatory Visit

## 2024-06-25 ENCOUNTER — Ambulatory Visit (RURAL_HEALTH_CENTER): Payer: Self-pay | Admitting: Family

## 2024-07-21 ENCOUNTER — Ambulatory Visit (RURAL_HEALTH_CENTER): Payer: Self-pay | Admitting: Family

## 2024-07-23 ENCOUNTER — Encounter (HOSPITAL_COMMUNITY): Payer: Self-pay

## 2024-07-23 ENCOUNTER — Inpatient Hospital Stay: Admit: 2024-07-23 | Admitting: Orthopaedic Surgery
